# Patient Record
Sex: Female | Born: 1945 | Race: White | Hispanic: No | Smoking: Never smoker
Health system: Southern US, Community
[De-identification: ages and names within clinical notes are randomized; demographics above are authoritative.]

---

## 2013-08-10 ENCOUNTER — Encounter (HOSPITAL_COMMUNITY): Payer: Self-pay | Admitting: Emergency Medicine

## 2013-08-10 ENCOUNTER — Emergency Department (HOSPITAL_COMMUNITY)
Admission: EM | Admit: 2013-08-10 | Discharge: 2013-08-10 | Disposition: A | Discharge status: Home / Self Care | Payer: Medicare Other | Attending: Emergency Medicine | Admitting: Emergency Medicine

## 2013-08-10 ENCOUNTER — Emergency Department (HOSPITAL_COMMUNITY): Payer: Medicare Other

## 2013-08-10 DIAGNOSIS — S0003XA Contusion of scalp, initial encounter: Secondary | ICD-10-CM | POA: Insufficient documentation

## 2013-08-10 DIAGNOSIS — W010XXA Fall on same level from slipping, tripping and stumbling without subsequent striking against object, initial encounter: Secondary | ICD-10-CM | POA: Insufficient documentation

## 2013-08-10 DIAGNOSIS — S92911A Unspecified fracture of right toe(s), initial encounter for closed fracture: Secondary | ICD-10-CM

## 2013-08-10 DIAGNOSIS — S0083XA Contusion of other part of head, initial encounter: Secondary | ICD-10-CM

## 2013-08-10 DIAGNOSIS — Y92009 Unspecified place in unspecified non-institutional (private) residence as the place of occurrence of the external cause: Secondary | ICD-10-CM | POA: Insufficient documentation

## 2013-08-10 DIAGNOSIS — Y9389 Activity, other specified: Secondary | ICD-10-CM | POA: Insufficient documentation

## 2013-08-10 DIAGNOSIS — S8000XA Contusion of unspecified knee, initial encounter: Secondary | ICD-10-CM | POA: Insufficient documentation

## 2013-08-10 DIAGNOSIS — S92919A Unspecified fracture of unspecified toe(s), initial encounter for closed fracture: Secondary | ICD-10-CM | POA: Insufficient documentation

## 2013-08-10 MED ORDER — HYDROCODONE-ACETAMINOPHEN 5-325 MG PO TABS
1.0000 | ORAL_TABLET | Freq: Once | ORAL | Status: AC
  Administered 2013-08-10: 1 via ORAL
  Filled 2013-08-10: qty 1

## 2013-08-10 MED ORDER — HYDROCODONE-ACETAMINOPHEN 5-325 MG PO TABS: 2.0000 | ORAL_TABLET | ORAL | Status: DC | PRN

## 2013-08-10 NOTE — ED Provider Notes (Signed)
CSN: [id number]     Arrival date & time 08/10/13  1823 History  This chart was scribed for non-physician practitioner, Dierdre Forth, PA-C working with Roney Marion, MD by Greggory Stallion, ED scribe. This patient was seen in room TR08C/TR08C and the patient's care was started at 8:41 PM.   Chief Complaint  Patient presents with  . Fall   The history is provided by the patient. No language interpreter was used.   HPI Comments: Wanda Howell is a 67 y.o. female who presents to the Emergency Department complaining of a fall that occurred around 3:45 PM today. She states she was carrying four big wine bottles when she tripped and landed face first on both of her knees. Pt has sudden onset bilateral knee pain and abrasions to her face with associated swelling. She states she also has left great toe pain with associated swelling. Bearing weight worsens her pain. Denies facial pain.    History reviewed. No pertinent past medical history. History reviewed. No pertinent past surgical history. History reviewed. No pertinent family history. History  Substance Use Topics  . Smoking status: Never Smoker   . Smokeless tobacco: Not on file  . Alcohol Use: Yes   OB History   Grav Para Term Preterm Abortions TAB SAB Ect Mult Living                 Review of Systems  Constitutional: Negative for fever, diaphoresis, appetite change, fatigue and unexpected weight change.  HENT: Positive for facial swelling. Negative for mouth sores.   Eyes: Negative for visual disturbance.  Respiratory: Negative for cough, chest tightness, shortness of breath and wheezing.   Cardiovascular: Negative for chest pain.  Gastrointestinal: Negative for nausea, vomiting, abdominal pain, diarrhea and constipation.  Endocrine: Negative for polydipsia, polyphagia and polyuria.  Genitourinary: Negative for dysuria, urgency, frequency and hematuria.  Musculoskeletal: Positive for arthralgias and joint swelling. Negative for  back pain and neck stiffness.  Skin: Positive for wound. Negative for rash.  Allergic/Immunologic: Negative for immunocompromised state.  Neurological: Negative for syncope, light-headedness and headaches.  Hematological: Does not bruise/bleed easily.  Psychiatric/Behavioral: Negative for sleep disturbance. The patient is not nervous/anxious.     Allergies  Review of patient's allergies indicates no known allergies.  Home Medications   Current Outpatient Rx  Name  Route  Sig  Dispense  Refill  . HYDROcodone-acetaminophen (NORCO/VICODIN) 5-325 MG per tablet   Oral   Take 2 tablets by mouth every 4 (four) hours as needed.   20 tablet   0     BP 152/76  Pulse 82  Resp 18  SpO2 100%  Physical Exam  Nursing note and vitals reviewed. Constitutional: She is oriented to person, place, and time. She appears well-developed and well-nourished. No distress.  Awake, alert, nontoxic appearance  HENT:  Head: Normocephalic.  Mouth/Throat: No oropharyngeal exudate.  Abrasions noted to patient nose, oozing blood Large ecchymosis and contusion to the Left inferior portion of the orbit  Eyes: Conjunctivae are normal. No scleral icterus.  Neck: Normal range of motion.  Cardiovascular: Intact distal pulses.   nontachycardic on nursing vitals  Pulmonary/Chest: Effort normal.  Musculoskeletal: Normal range of motion. She exhibits no edema.  Swelling and ecchymosis of the left great toe with pain to palpation Patient moving all large joints of her own accord without prompting  Neurological: She is alert and oriented to person, place, and time.  Speech is clear and goal oriented Moves extremities without ataxia  Skin: Skin is warm and dry. She is not diaphoretic.    ED Course  Procedures (including critical care time)  DIAGNOSTIC STUDIES: Oxygen Saturation is 100% on RA, normal by my interpretation.    COORDINATION OF CARE: 8:52 PM-Discussed treatment plan which includes xrays with pt  at bedside and pt agreed to plan.   Labs Review Labs Reviewed - No data to display Imaging Review Dg Knee Complete 4 Views Left  08/10/2013   CLINICAL DATA:  Fall, blunt trauma  EXAM: LEFT KNEE - COMPLETE 4+ VIEW  COMPARISON:  None.  FINDINGS: Osteopenia noted. No fracture or dislocation of left knee. No joint effusion.  IMPRESSION: No fracture.  Osteopenia.   Electronically Signed   By: Genevive Bi M.D.   On: 08/10/2013 20:51   Dg Knee Complete 4 Views Right  08/10/2013   CLINICAL DATA:  Fall, blunt trauma  EXAM: RIGHT KNEE - COMPLETE 4+ VIEW  COMPARISON:  None.  FINDINGS: Osteopenia noted. No evidence of fracture dislocation of the right knee. There is a suprapatellar joint effusion.  IMPRESSION: 1. No fracture. 2. Joint effusion.   Electronically Signed   By: Genevive Bi M.D.   On: 08/10/2013 20:53   Dg Foot Complete Left  08/10/2013   CLINICAL DATA:  Fall, blunt trauma  EXAM: LEFT FOOT - COMPLETE 3+ VIEW  COMPARISON:  None.  FINDINGS: There is a vertical fracture through the proximal phalanx of the 1st digit which enters the articular surface of the 1st metatarsal phalangeal joint. No additional evidence of fraction of the midfoot or forefoot. Calcaneus is normal.  IMPRESSION: Intraarticular fracture at the base of the proximal phalanx of the 1st digit.   Electronically Signed   By: Genevive Bi M.D.   On: 08/10/2013 20:52    EKG Interpretation   None       MDM   1. Toe fracture, right, closed, initial encounter   2. Facial contusion, initial encounter   3. Knee contusion, unspecified laterality, initial encounter     MORGIN HALLS presents after fall with facial swelling and ecchymosis, swelling and ecchymosis of the great toe and c/o bilateral knee pain.  Pt reports that she is unwilling to see a PA and demands evaluation by an MD.  Pt does not want her face touched. She was advised that there could be a possible fracture but still refused it to be examined or xrayed.  She is uncooperative with the history and physical exam.  Patient discussed with Rolland Porter, MD who will assess the patient.    X-rays with Left great toe interarticular fracture at the base of the proximal phalanx.  Pt has been evaluated by Dr. Fayrene Fearing and he is comfortable with discharge.  Pt will be given post-op shoe and crutches.  Advised her to f/u with PCP.    It has been determined that no acute conditions requiring further emergency intervention are present at this time. The patient/guardian have been advised of the diagnosis and plan. We have discussed signs and symptoms that warrant return to the ED, such as changes or worsening in symptoms.   Vital signs are stable at discharge.   BP 138/83  Pulse 84  Resp 16  SpO2 100%  Patient/guardian has voiced understanding and agreed to follow-up with the PCP or specialist.       Dierdre Forth, PA-C 08/11/13 1512

## 2013-08-10 NOTE — ED Notes (Signed)
Pt reports at 1545 this pm she was carrying 4 big wine bottles to the recycle bin, reports as she opened the gate to get out of her apartment complex she fell face first landing on the curb and landed on both knees. Neighbor at bedside states as she walked by she noticed pt was laying in on the ground approx 1800 she was concerned because pt was bleeding profusely from her nose. Pt refusing to have her temperature taken in triage. Pt states she was going to go to an orthopedic tomorrow to have her knees evaluated and to get pain medication.

## 2013-08-10 NOTE — ED Notes (Signed)
Pt denies having a PCP, states her retired several years ago and "felt no need to get another one."

## 2013-08-10 NOTE — ED Notes (Signed)
Dr. James at bedside  

## 2013-08-10 NOTE — ED Provider Notes (Addendum)
Face-to-face interaction: Patient examination. She is some soft tissue swelling to the left face. She denies pain. She is not tender. She has a normal bite without malocclusion or dental trauma. She has no septal hematoma. She is an abrasion on the dorsum of nose. No septal hematoma. . No blood over the TMs. She is normal V1 through V3 distribution sensation in the face. She has normal facial musculature movement. She is nontender midline neck and spine. She has abrasions but no effusions to both knees. She has soft tissue swelling over the great toe with ecchymosis.  No indications for facial imaging. Discussed with her that she will likely develop signigicant eccymosis to the left face.. She is comfortable without imaging at this time with her normal exam. His given by mouth pain meds her discharge her pain medications. We instructed heron crutches and a cast shoe.  I have asked that she be nonweightbearing until orthopedic followup  Roney Marion, MD 08/10/13 2154  Roney Marion, MD 08/11/13 2251

## 2013-08-10 NOTE — ED Notes (Signed)
Pt comfortable with d/c and f/u instructions. Pt d/c'd with crutches and wheeled to front.

## 2013-08-10 NOTE — ED Notes (Signed)
Spoke with Dahlia Client who reports she will take a look at patient.

## 2013-08-11 ENCOUNTER — Encounter (HOSPITAL_COMMUNITY): Payer: Self-pay | Admitting: Emergency Medicine

## 2013-08-11 ENCOUNTER — Emergency Department (HOSPITAL_COMMUNITY)
Admission: EM | Admit: 2013-08-11 | Discharge: 2013-08-11 | Disposition: A | Discharge status: Home / Self Care | Payer: Medicare Other | Attending: Emergency Medicine | Admitting: Emergency Medicine

## 2013-08-11 DIAGNOSIS — M7989 Other specified soft tissue disorders: Secondary | ICD-10-CM

## 2013-08-11 DIAGNOSIS — G8911 Acute pain due to trauma: Secondary | ICD-10-CM | POA: Insufficient documentation

## 2013-08-11 NOTE — ED Notes (Signed)
MD at bedside. 

## 2013-08-11 NOTE — ED Notes (Signed)
L foot noted to be swollen and painful to touch. Large blister noted to L great toe. Able to wiggle digits. Capillary refill less than 2 seconds. Pt states pain has increased since fall. 8/10 pain at the time. Pt refusing vital signs upon arrival to ED.

## 2013-08-11 NOTE — ED Notes (Signed)
Pt presents to department via [facility] for evaluation of L great toe pain. States she fell at home yesterday. States redness, pain and blisters noted to L toe. Pedal pulses present. Pt is alert and oriented x4.

## 2013-08-11 NOTE — ED Provider Notes (Signed)
Medical screening examination/treatment/procedure(s) were conducted as a shared visit with non-physician practitioner(s) and myself.  I personally evaluated the patient during the encounter.  EKG Interpretation   None         Roney Marion, MD 08/11/13 2248

## 2013-08-11 NOTE — ED Notes (Signed)
Call received from Pt's friend to state that the power is back on at her home and she will be here in around . To pick her up. Pt. Assisted to wheel chair and escorted to lobby.

## 2013-08-11 NOTE — ED Notes (Signed)
Patient stated she did not want to be connected to the heart monitor or have her temperature taken.

## 2013-08-11 NOTE — ED Provider Notes (Signed)
CSN: [id number]     Arrival date & time 08/11/13  1844 History   First MD Initiated Contact with Patient 08/11/13 1853     Chief Complaint  Patient presents with  . Toe Pain   (Consider location/radiation/quality/duration/timing/severity/associated sxs/prior Treatment) Patient is a 67 y.o. female presenting with toe pain.  Toe Pain This is a new problem. The current episode started yesterday. The problem occurs constantly. The problem has not changed since onset.Pertinent negatives include no chest pain, no abdominal pain and no shortness of breath. Associated symptoms comments: Generalized weakness. Nothing aggravates the symptoms. Nothing relieves the symptoms. She has tried nothing for the symptoms.    History reviewed. No pertinent past medical history. History reviewed. No pertinent past surgical history. No family history on file. History  Substance Use Topics  . Smoking status: Never Smoker   . Smokeless tobacco: Not on file  . Alcohol Use: Yes   OB History   Grav Para Term Preterm Abortions TAB SAB Ect Mult Living                 Review of Systems  Respiratory: Negative for shortness of breath.   Cardiovascular: Negative for chest pain.  Gastrointestinal: Negative for abdominal pain.  All other systems reviewed and are negative.    Allergies  Review of patient's allergies indicates no known allergies.  Home Medications   Current Outpatient Rx  Name  Route  Sig  Dispense  Refill  . HYDROcodone-acetaminophen (NORCO/VICODIN) 5-325 MG per tablet   Oral   Take 2 tablets by mouth every 4 (four) hours as needed (pain).          BP 135/66  Pulse 83  Resp 12  SpO2 100% Physical Exam  Nursing note and vitals reviewed. Constitutional: She is oriented to person, place, and time. She appears well-developed and well-nourished. No distress.  HENT:  Head: Normocephalic and atraumatic.  Eyes: Conjunctivae are normal. No scleral icterus.  Neck: Neck supple.   Cardiovascular: Normal rate and intact distal pulses.   Pulmonary/Chest: Effort normal. No stridor. No respiratory distress.  Abdominal: Normal appearance. She exhibits no distension.  Musculoskeletal:  Left great toe is moderately swollen with a bullous lesion to dorsal aspect.  Skin intact.  Surrounding ecchymosis. Normal motor function.    Neurological: She is alert and oriented to person, place, and time.  Skin: Skin is warm and dry. No rash noted.  Psychiatric: She has a normal mood and affect. Her behavior is normal.    ED Course  Procedures (including critical care time) Labs Review Labs Reviewed - No data to display Imaging Review Dg Knee Complete 4 Views Left  08/10/2013   CLINICAL DATA:  Fall, blunt trauma  EXAM: LEFT KNEE - COMPLETE 4+ VIEW  COMPARISON:  None.  FINDINGS: Osteopenia noted. No fracture or dislocation of left knee. No joint effusion.  IMPRESSION: No fracture.  Osteopenia.   Electronically Signed   By: Genevive Bi M.D.   On: 08/10/2013 20:51   Dg Knee Complete 4 Views Right  08/10/2013   CLINICAL DATA:  Fall, blunt trauma  EXAM: RIGHT KNEE - COMPLETE 4+ VIEW  COMPARISON:  None.  FINDINGS: Osteopenia noted. No evidence of fracture dislocation of the right knee. There is a suprapatellar joint effusion.  IMPRESSION: 1. No fracture. 2. Joint effusion.   Electronically Signed   By: Genevive Bi M.D.   On: 08/10/2013 20:53   Dg Foot Complete Left  08/10/2013   CLINICAL DATA:  Fall, blunt trauma  EXAM: LEFT FOOT - COMPLETE 3+ VIEW  COMPARISON:  None.  FINDINGS: There is a vertical fracture through the proximal phalanx of the 1st digit which enters the articular surface of the 1st metatarsal phalangeal joint. No additional evidence of fraction of the midfoot or forefoot. Calcaneus is normal.  IMPRESSION: Intraarticular fracture at the base of the proximal phalanx of the 1st digit.   Electronically Signed   By: Genevive Bi M.D.   On: 08/10/2013 20:52    EKG  Interpretation   None       MDM   1. Toe swelling    67 yo female who fell yesterday and hurt her knees and her left great toe.  She states that power went out at her apartment.  She crawled on the floor to her telephone because she was scared to put weight on her knees.  She wanted EMS to help her to a different floor where she can stay with a friend.  She reports that they wanted her to get her toe checked out due to swelling. Her toe is swelling with a blister to the dorsal aspect. Pain control. No lacerations. She is worried that the swelling occurred because she was unable to elevate her foot during the day today. I think the swelling is post traumatic and uncomplicated.  She states she will followup with her orthopedist tomorrow. She also states that her friend will help take care of her and that she will be able to get around with friend's assistance.     Candyce Churn, MD 08/12/13 [phone]

## 2015-06-02 IMAGING — CR DG KNEE COMPLETE 4+V*L*
4 series · 4 of 4 positions shown · non-contrast
Comparison: None.

CLINICAL DATA: Fall, blunt trauma

EXAM:
LEFT KNEE - COMPLETE 4+ VIEW

[t knee ap left *]
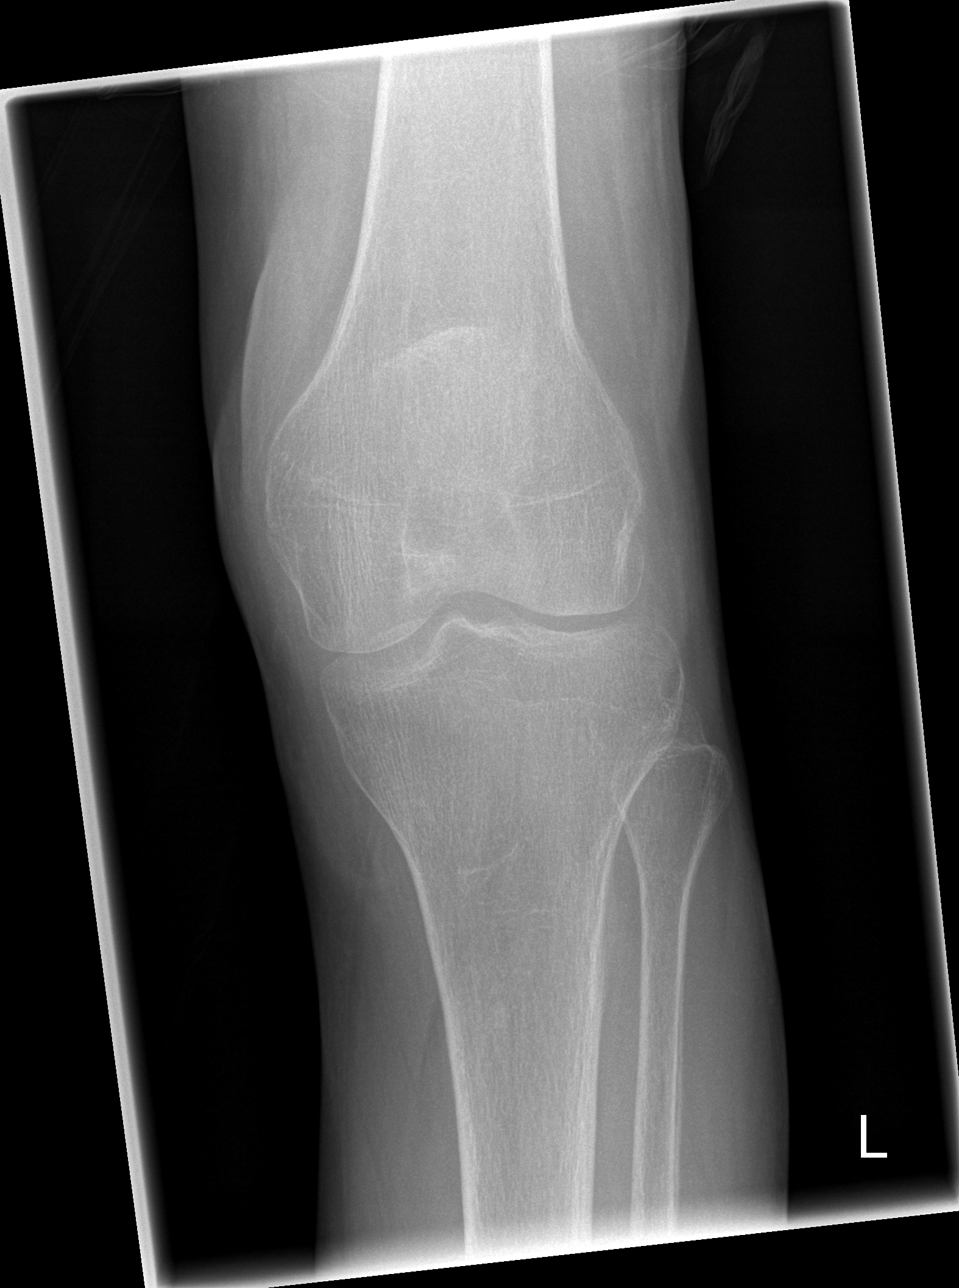

[t knee oblique left]
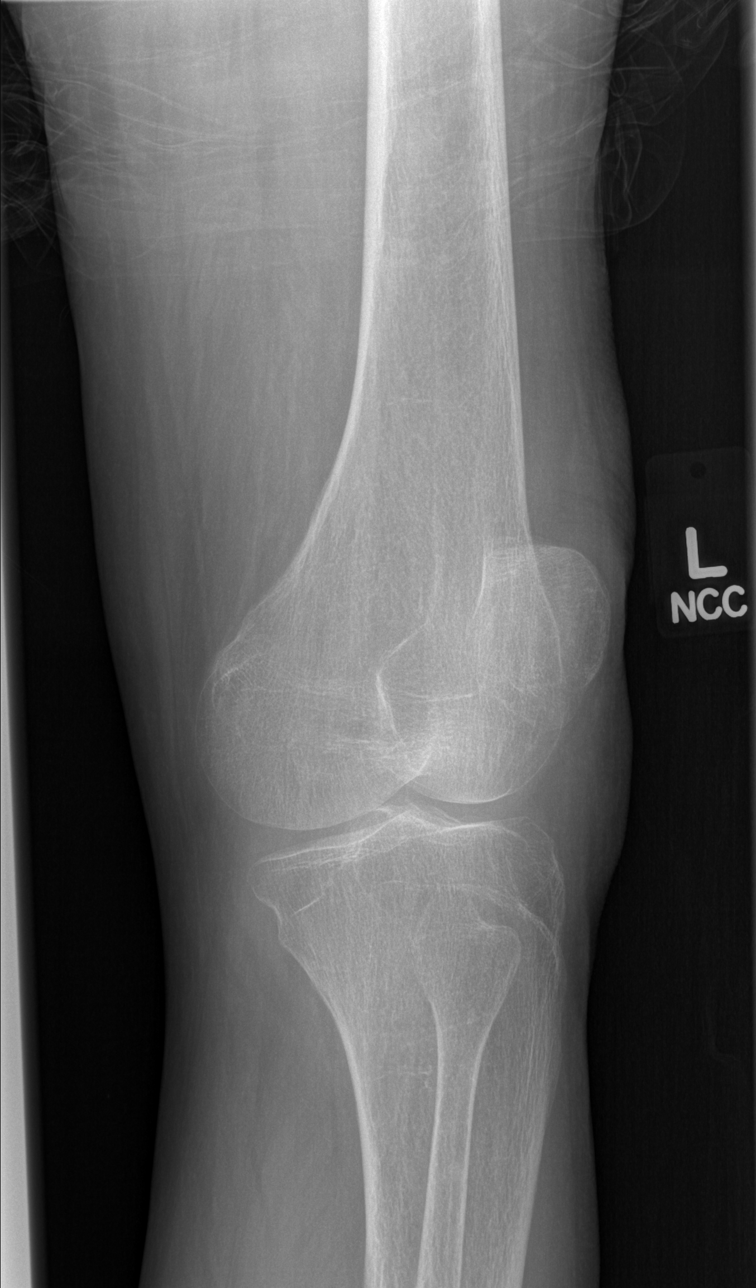

[t knee oblique left *]
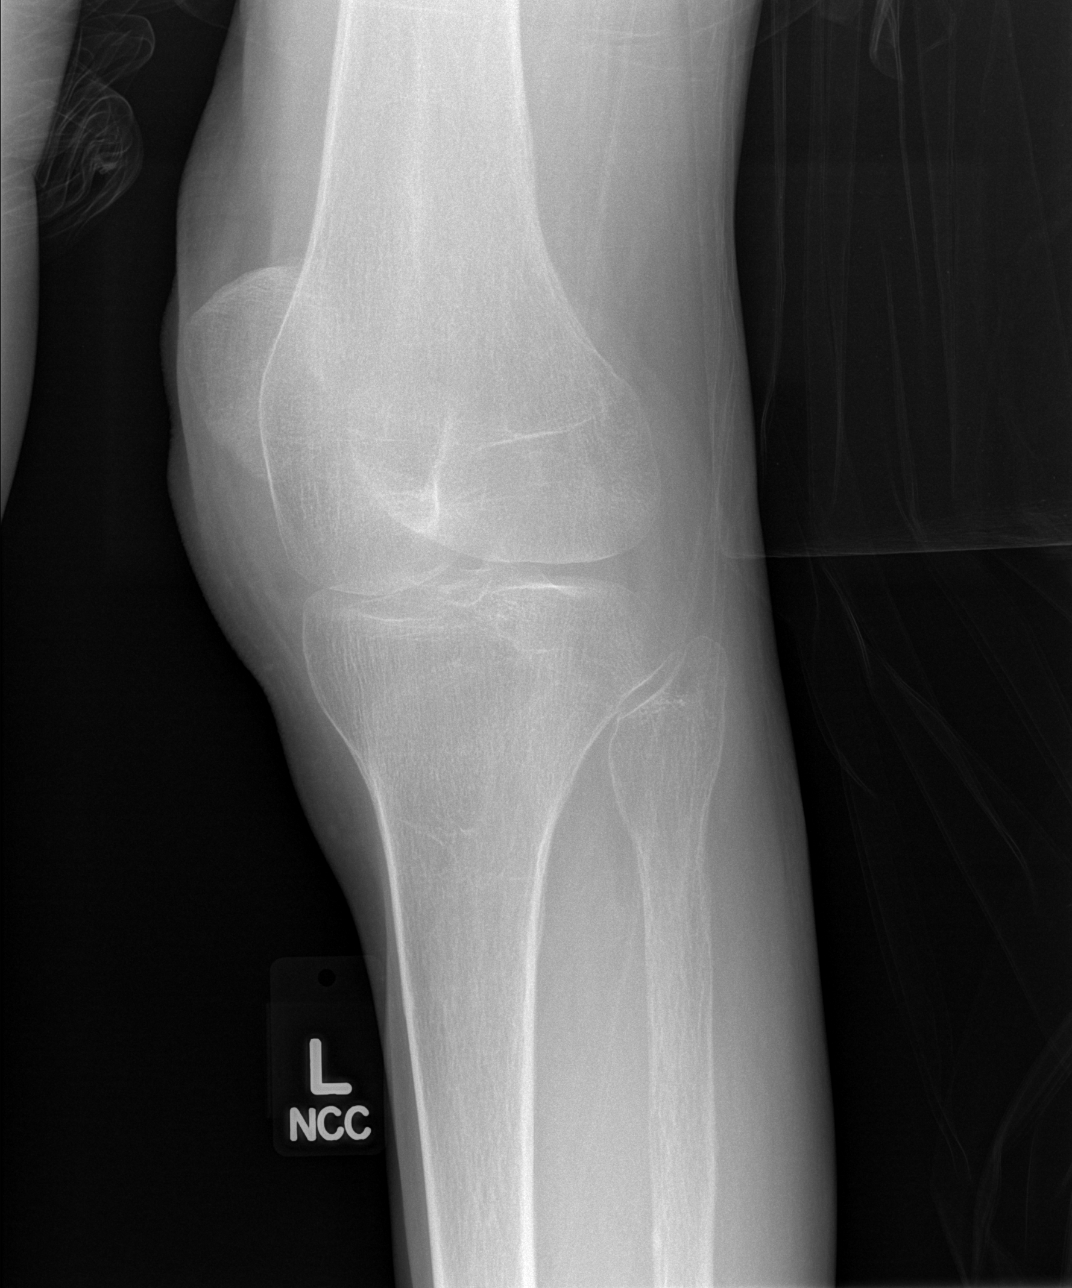

[t knee lat left]
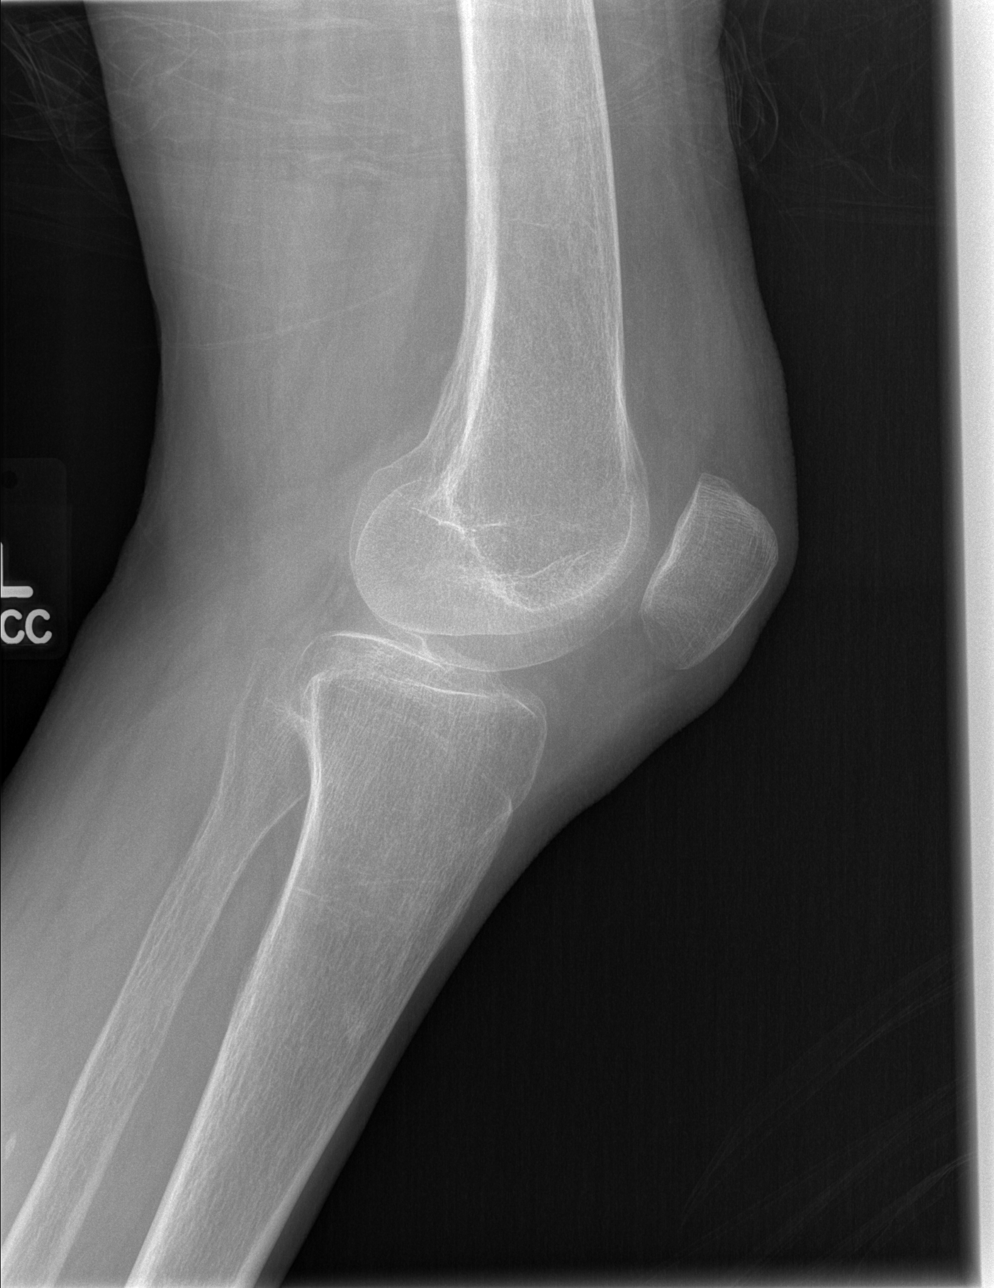

[4 of 4 positions shown; findings below may reference images not displayed]

FINDINGS: Osteopenia noted. No fracture or dislocation of left knee. No joint
effusion.
IMPRESSION: No fracture.  Osteopenia.

## 2022-11-29 ENCOUNTER — Other Ambulatory Visit: Payer: Self-pay

## 2022-11-29 ENCOUNTER — Emergency Department (HOSPITAL_COMMUNITY): Payer: Medicare Other

## 2022-11-29 ENCOUNTER — Encounter (HOSPITAL_COMMUNITY): Payer: Self-pay

## 2022-11-29 ENCOUNTER — Inpatient Hospital Stay (HOSPITAL_COMMUNITY)
Admission: EM | Admit: 2022-11-29 | Discharge: 2022-12-01 | DRG: 064 | Disposition: A | Discharge status: Home / Self Care | Payer: Medicare Other | Attending: Internal Medicine | Admitting: Internal Medicine

## 2022-11-29 DIAGNOSIS — R471 Dysarthria and anarthria: Secondary | ICD-10-CM | POA: Diagnosis present

## 2022-11-29 DIAGNOSIS — S32592A Other specified fracture of left pubis, initial encounter for closed fracture: Secondary | ICD-10-CM | POA: Diagnosis present

## 2022-11-29 DIAGNOSIS — R7989 Other specified abnormal findings of blood chemistry: Secondary | ICD-10-CM | POA: Diagnosis not present

## 2022-11-29 DIAGNOSIS — N179 Acute kidney failure, unspecified: Secondary | ICD-10-CM | POA: Diagnosis present

## 2022-11-29 DIAGNOSIS — R29810 Facial weakness: Secondary | ICD-10-CM | POA: Diagnosis present

## 2022-11-29 DIAGNOSIS — S0101XA Laceration without foreign body of scalp, initial encounter: Secondary | ICD-10-CM | POA: Diagnosis present

## 2022-11-29 DIAGNOSIS — Z7902 Long term (current) use of antithrombotics/antiplatelets: Secondary | ICD-10-CM

## 2022-11-29 DIAGNOSIS — G8324 Monoplegia of upper limb affecting left nondominant side: Secondary | ICD-10-CM | POA: Diagnosis present

## 2022-11-29 DIAGNOSIS — I639 Cerebral infarction, unspecified: Secondary | ICD-10-CM | POA: Diagnosis not present

## 2022-11-29 DIAGNOSIS — Z23 Encounter for immunization: Secondary | ICD-10-CM

## 2022-11-29 DIAGNOSIS — E559 Vitamin D deficiency, unspecified: Secondary | ICD-10-CM | POA: Diagnosis present

## 2022-11-29 DIAGNOSIS — Z66 Do not resuscitate: Secondary | ICD-10-CM | POA: Diagnosis present

## 2022-11-29 DIAGNOSIS — M818 Other osteoporosis without current pathological fracture: Secondary | ICD-10-CM | POA: Diagnosis present

## 2022-11-29 DIAGNOSIS — D649 Anemia, unspecified: Secondary | ICD-10-CM | POA: Diagnosis present

## 2022-11-29 DIAGNOSIS — E785 Hyperlipidemia, unspecified: Secondary | ICD-10-CM | POA: Diagnosis present

## 2022-11-29 DIAGNOSIS — I10 Essential (primary) hypertension: Secondary | ICD-10-CM | POA: Diagnosis present

## 2022-11-29 DIAGNOSIS — Z7982 Long term (current) use of aspirin: Secondary | ICD-10-CM | POA: Diagnosis not present

## 2022-11-29 DIAGNOSIS — W19XXXA Unspecified fall, initial encounter: Secondary | ICD-10-CM

## 2022-11-29 DIAGNOSIS — R29706 NIHSS score 6: Secondary | ICD-10-CM | POA: Diagnosis present

## 2022-11-29 DIAGNOSIS — I214 Non-ST elevation (NSTEMI) myocardial infarction: Secondary | ICD-10-CM | POA: Diagnosis present

## 2022-11-29 DIAGNOSIS — I739 Peripheral vascular disease, unspecified: Secondary | ICD-10-CM | POA: Diagnosis present

## 2022-11-29 DIAGNOSIS — R4781 Slurred speech: Secondary | ICD-10-CM

## 2022-11-29 DIAGNOSIS — W1830XA Fall on same level, unspecified, initial encounter: Secondary | ICD-10-CM | POA: Diagnosis present

## 2022-11-29 DIAGNOSIS — S92911A Unspecified fracture of right toe(s), initial encounter for closed fracture: Secondary | ICD-10-CM | POA: Diagnosis present

## 2022-11-29 DIAGNOSIS — Y92009 Unspecified place in unspecified non-institutional (private) residence as the place of occurrence of the external cause: Secondary | ICD-10-CM

## 2022-11-29 DIAGNOSIS — I6359 Cerebral infarction due to unspecified occlusion or stenosis of other cerebral artery: Secondary | ICD-10-CM | POA: Diagnosis present

## 2022-11-29 LAB — CBC WITH DIFFERENTIAL/PLATELET
Abs Immature Granulocytes: 0.05 10*3/uL (ref 0.00–0.07)
Basophils Absolute: 0 10*3/uL (ref 0.0–0.1)
Basophils Relative: 0 %
Eosinophils Absolute: 0 10*3/uL (ref 0.0–0.5)
Eosinophils Relative: 0 %
HCT: 38.1 % (ref 36.0–46.0)
Hemoglobin: 12.6 g/dL (ref 12.0–15.0)
Immature Granulocytes: 0 %
Lymphocytes Relative: 6 %
Lymphs Abs: 0.9 10*3/uL (ref 0.7–4.0)
MCH: 37.2 pg — ABNORMAL HIGH (ref 26.0–34.0)
MCHC: 33.1 g/dL (ref 30.0–36.0)
MCV: 112.4 fL — ABNORMAL HIGH (ref 80.0–100.0)
Monocytes Absolute: 0.9 10*3/uL (ref 0.1–1.0)
Monocytes Relative: 6 %
Neutro Abs: 14.1 10*3/uL — ABNORMAL HIGH (ref 1.7–7.7)
Neutrophils Relative %: 88 %
Platelets: 215 10*3/uL (ref 150–400)
RBC: 3.39 MIL/uL — ABNORMAL LOW (ref 3.87–5.11)
RDW: 14.3 % (ref 11.5–15.5)
WBC: 16 10*3/uL — ABNORMAL HIGH (ref 4.0–10.5)
nRBC: 0 % (ref 0.0–0.2)

## 2022-11-29 LAB — BASIC METABOLIC PANEL
Anion gap: 12 (ref 5–15)
BUN: 19 mg/dL (ref 8–23)
CO2: 19 mmol/L — ABNORMAL LOW (ref 22–32)
Calcium: 9.2 mg/dL (ref 8.9–10.3)
Chloride: 108 mmol/L (ref 98–111)
Creatinine, Ser: 1.16 mg/dL — ABNORMAL HIGH (ref 0.44–1.00)
GFR, Estimated: 49 mL/min — ABNORMAL LOW (ref >60)
Glucose, Bld: 96 mg/dL (ref 70–99)
Potassium: 3.9 mmol/L (ref 3.5–5.1)
Sodium: 139 mmol/L (ref 135–145)

## 2022-11-29 LAB — LIPID PANEL
Cholesterol: 190 mg/dL (ref 0–200)
HDL: 71 mg/dL (ref >40)
LDL Cholesterol: 105 mg/dL — ABNORMAL HIGH (ref 0–99)
Total CHOL/HDL Ratio: 2.7 RATIO
Triglycerides: 68 mg/dL (ref <150)
VLDL: 14 mg/dL (ref 0–40)

## 2022-11-29 LAB — TROPONIN I (HIGH SENSITIVITY)
Troponin I (High Sensitivity): 2040 ng/L (ref <18)
Troponin I (High Sensitivity): 1896 ng/L (ref <18)

## 2022-11-29 LAB — HEPARIN LEVEL (UNFRACTIONATED): Heparin Unfractionated: 0.29 IU/mL — ABNORMAL LOW (ref 0.30–0.70)

## 2022-11-29 MED ORDER — HEPARIN BOLUS VIA INFUSION
3700.0000 [IU] | Freq: Once | INTRAVENOUS | Status: AC
  Administered 2022-11-29: 3700 [IU] via INTRAVENOUS
  Filled 2022-11-29: qty 3700

## 2022-11-29 MED ORDER — ROSUVASTATIN CALCIUM 20 MG PO TABS
20.0000 mg | ORAL_TABLET | Freq: Every day | ORAL | Status: DC
  Administered 2022-11-29: 20 mg via ORAL
  Filled 2022-11-29: qty 1

## 2022-11-29 MED ORDER — SODIUM CHLORIDE 0.9 % IV BOLUS
500.0000 mL | Freq: Once | INTRAVENOUS | Status: AC
  Administered 2022-11-29: 500 mL via INTRAVENOUS

## 2022-11-29 MED ORDER — ASPIRIN 81 MG PO TBEC
81.0000 mg | DELAYED_RELEASE_TABLET | Freq: Every day | ORAL | Status: DC
  Administered 2022-11-29 – 2022-12-01 (×3): 81 mg via ORAL
  Filled 2022-11-29 (×4): qty 1

## 2022-11-29 MED ORDER — TETANUS-DIPHTH-ACELL PERTUSSIS 5-2.5-18.5 LF-MCG/0.5 IM SUSY
0.5000 mL | PREFILLED_SYRINGE | Freq: Once | INTRAMUSCULAR | Status: AC
  Administered 2022-11-29: 0.5 mL via INTRAMUSCULAR
  Filled 2022-11-29: qty 0.5

## 2022-11-29 MED ORDER — HEPARIN (PORCINE) 25000 UT/250ML-% IV SOLN
800.0000 [IU]/h | INTRAVENOUS | Status: DC
  Administered 2022-11-29: 750 [IU]/h via INTRAVENOUS
  Administered 2022-11-30: 800 [IU]/h via INTRAVENOUS
  Filled 2022-11-29 (×2): qty 250

## 2022-11-29 MED ORDER — LIDOCAINE-EPINEPHRINE (PF) 2 %-1:200000 IJ SOLN
10.0000 mL | Freq: Once | INTRAMUSCULAR | Status: AC
  Administered 2022-11-29: 10 mL
  Filled 2022-11-29: qty 20

## 2022-11-29 MED ORDER — GERHARDT'S BUTT CREAM
TOPICAL_CREAM | Freq: Two times a day (BID) | CUTANEOUS | Status: DC
  Filled 2022-11-29 (×2): qty 1

## 2022-11-29 NOTE — Consult Note (Signed)
Cardiology Consultation   Patient ID: Wanda Howell MRN: [id number]; DOB: [birth year]  Admit date: 11/29/2022 Date of Consult: 11/29/2022  PCP:  Patient, No Pcp Per   [place] Providers Cardiologist:  None        Patient Profile:   Wanda Howell is a 77 y.o. female with minimal medical history who is being seen 11/29/2022 for the evaluation of elevated troponin at the request of Dr. Laverta Baltimore.  History of Present Illness:   Wanda Howell presented to the ED today with EMS after a fall this morning. Per patient, while washing her hands in the kitchen sink, she had acute weakness of her left arm. This was followed shortly by an inability to keep her head lifted up and she then found herself falling forward, first hitting her face on the stove, then collapsing backwards. She crawled/slide back to her room and called the office at her apartment complex. An office worker/friend came to help her up but they ultimately called 911. Patient denies any prodromal symptoms including feeling lightheaded, dizzy, palpitations. Of note, patient had concern around the same time that her speech was slurred, and per patient's office manager/friend who is at bedside, speech has been abnormal today, though is slowly improving, almost back to normal now. Regarding recent hx, she has generally been feeling well though says that she had a brief period of "burning chest pain" while laying down yesterday morning after breakfast. She fell asleep and pain was gone by the time she woke up. No recurrence. She also notes recent symptoms of pain in the bottoms of her feet with ambulation. This apparently improves after several minutes. Patient also adds that her toes have become more pale/diseased appearing in recent months.   Patiently apparently does not have a regular primary care provider. She saw an OBGYN "20 years ago" and an orthopedist several years back following a fall. Otherwise, has not seen any medical  providers since. She denies hx htn, hld, DM.   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 Mild associated edema without mass effect.  History reviewed. No pertinent past medical history.  History reviewed. No pertinent surgical history.   Home Medications:  Prior to Admission medications   Medication Sig Start Date End Date Taking? Authorizing Provider  HYDROcodone-acetaminophen (NORCO/VICODIN) 5-325 MG per tablet Take 2 tablets by mouth every 4 (four) hours as needed (pain).    [provider]    Inpatient Medications: Scheduled Meds:   Continuous Infusions:  heparin 750 Units/hr (11/29/22 1535)   PRN Meds:   Allergies:   No Known Allergies  Social History:   Social History   Socioeconomic History   Marital status: Widowed    Spouse name: Not on file   Number of children: Not on file   Years of education: Not on file   Highest education level: Not on file  Occupational History   Not on file  Tobacco Use   Smoking status: Never   Smokeless tobacco: Not on file  Substance and Sexual Activity   Alcohol use: Yes   Drug use: No   Sexual activity: Not on file  Other Topics Concern   Not on file  Social History Narrative   Not on file   Social Determinants of Health   Financial Resource Strain: Not on file  Food Insecurity: Not on file  Transportation Needs: Not on file  Physical Activity: Not on  file  Stress: Not on file  Social Connections: Not on file  Intimate Partner Violence: Not on file    Family History:   History reviewed. No pertinent family history.   ROS:  Please see the history of present illness.   All other ROS reviewed and negative.     Physical Exam/Data:   Vitals:   11/29/22 1130 11/29/22 1145 11/29/22 1415 11/29/22 1544  BP: (!) 147/79 (!) 156/91  (!) 152/83  Pulse: 75 77 77 79  Resp: 19 14 12 18   Temp:    97.9 F (36.6 C)   TempSrc:    Oral  SpO2: 100% 100% 100% 100%   No intake or output data in the 24 hours ending 11/29/22 1552     No data to display           There is no height or weight on file to calculate BMI.  General:  Well nourished, well developed, in no acute distress HEENT: occipital laceration s/p sutures Neck: no JVD Vascular: No carotid bruits; Distal pulses 2+ bilaterally Cardiac:  normal S1, S2; RRR; no murmur  Lungs:  clear to auscultation bilaterally, no wheezing, rhonchi or rales  Abd: soft, nontender, no hepatomegaly  Ext: no edema. Toes appear atrophic, pale, poorly perfused/diseased. Third toe of right foot is most concerning. 2+ dorsalis pedis pulses bilaterally though feet are both quite cool to the touch. Prominent venous stasis bilaterally. Musculoskeletal:  No deformities, BUE and BLE strength normal and equal Skin: warm and dry  Neuro:  some mild left side facial droop. Very mildly slurred speech. Extremity strength grossly equal Psych:  Normal affect   EKG:  The EKG was personally reviewed and demonstrates:  sinus rhythm, non-specific T wave abnormalities in leads II, III, aVF, V4-V6.  Relevant CV Studies: No studies found  Laboratory Data:  High Sensitivity Troponin:   Recent Labs  Lab 11/29/22 1112 11/29/22 1243  TROPONINIHS 2,040* 1,896*     Chemistry Recent Labs  Lab 11/29/22 1112  NA 139  K 3.9  CL 108  CO2 19*  GLUCOSE 96  BUN 19  CREATININE 1.16*  CALCIUM 9.2  GFRNONAA 49*  ANIONGAP 12    No results for input(s): "PROT", "ALBUMIN", "AST", "ALT", "ALKPHOS", "BILITOT" in the last 168 hours. Lipids No results for input(s): "CHOL", "TRIG", "HDL", "LABVLDL", "LDLCALC", "CHOLHDL" in the last 168 hours.  Hematology Recent Labs  Lab 11/29/22 1112  WBC 16.0*  RBC 3.39*  HGB 12.6  HCT 38.1  MCV 112.4*  MCH 37.2*  MCHC 33.1  RDW 14.3  PLT 215   Thyroid No results for input(s): "TSH", "FREET4" in the last 168 hours.  BNPNo results for  input(s): "BNP", "PROBNP" in the last 168 hours.  DDimer No results for input(s): "DDIMER" in the last 168 hours.   Radiology/Studies:  MR BRAIN WO CONTRAST  Result Date: 11/29/2022 CLINICAL DATA:  Transient ischemic attack (TIA) EXAM: MRI HEAD WITHOUT CONTRAST TECHNIQUE: Multiplanar, multiecho pulse sequences of the brain and surrounding structures were obtained without intravenous contrast. COMPARISON:  CT head from the same day. 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 Mild associated edema without mass effect. No evidence of acute hemorrhage, mass lesion, midline shift or hydrocephalus. Remote left cerebellar infarct. Vascular: Major arterial flow voids are maintained at the skull base. Skull and upper cervical spine: Normal marrow signal. Sinuses/Orbits: Left maxillary sinus retention cysts. Otherwise, clear sinuses. No acute orbital findings. Other: No mastoid effusions. IMPRESSION: Acute right frontal  cortical and subcortical infarct. Electronically Signed   By: Margaretha Sheffield M.D.   On: 11/29/2022 15:22   CT Cervical Spine Wo Contrast  Result Date: 11/29/2022 CLINICAL DATA:  Trauma EXAM: CT CERVICAL SPINE WITHOUT CONTRAST TECHNIQUE: Multidetector CT imaging of the cervical spine was performed without intravenous contrast. Multiplanar CT image reconstructions were also generated. RADIATION DOSE REDUCTION: This exam was performed according to the departmental dose-optimization program which includes automated exposure control, adjustment of the mA and/or kV according to patient size and/or use of iterative reconstruction technique. COMPARISON:  None Available. FINDINGS: Alignment: Normal. Skull base and vertebrae: No acute fracture. No primary bone lesion or focal pathologic process. Soft tissues and spinal canal: No prevertebral fluid or swelling. No visible canal hematoma. Disc levels: Disc space narrowing with marginal osteophyte formation identified C5-6 and  C6-7. Osteoarthritis identified at C1-C2. Upper chest: Negative. Other: None. IMPRESSION: Degenerative changes.  No acute traumatic abnormalities. Electronically Signed   By: Sammie Bench M.D.   On: 11/29/2022 12:33   CT Head Wo Contrast  Result Date: 11/29/2022 CLINICAL DATA:  Head trauma, moderate-severe EXAM: CT HEAD WITHOUT CONTRAST TECHNIQUE: Contiguous axial images were obtained from the base of the skull through the vertex without intravenous contrast. RADIATION DOSE REDUCTION: This exam was performed according to the departmental dose-optimization program which includes automated exposure control, adjustment of the mA and/or kV according to patient size and/or use of iterative reconstruction technique. COMPARISON:  None Available. FINDINGS: Brain: There is periventricular white matter decreased attenuation consistent with small vessel ischemic changes. Ventricles, sulci and cisterns are prominent consistent with age related involutional changes. No acute intracranial hemorrhage, mass effect or shift. No hydrocephalus. Vascular: No hyperdense vessel or unexpected calcification. Skull: Normal. Negative for fracture or focal lesion. Sinuses/Orbits: No acute finding. IMPRESSION: Atrophy and chronic small vessel ischemic changes. No acute intracranial process identified. Electronically Signed   By: Sammie Bench M.D.   On: 11/29/2022 12:29   DG Pelvis Portable  Result Date: 11/29/2022 CLINICAL DATA:  Fall. EXAM: PORTABLE PELVIS 1-2 VIEWS COMPARISON:  None Available. FINDINGS: There is diffuse decreased bone mineralization. There are markedly severe degenerative changes of the superior left femoroacetabular joint with chronic moderate superior left femoral head and adjacent acetabular cortical erosion and bone remodeling with diffuse subchondral sclerosis and cystic change. 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 Mild to moderate superomedial right femoroacetabular  joint space narrowing. There is oblique linear lucency within the left inferior pubic ramus suggesting an acute fracture with up to approximately 3 mm diastasis otherwise no significant displacement. No definitive additional pelvic fracture is visualized, however usually there is more than one pelvic ring fracture, and it is possible a second left superior pubic ramus or left acetabular fracture is not visualized on this limited single frontal view. IMPRESSION: 1. Acute minimally displaced fracture of the left inferior pubic ramus. 2. No definitive additional pelvic fracture is visualized, however usually there is more than one pelvic ring fracture, and it is possible a second left superior pubic ramus or left acetabular fracture is not visualized on this limited frontal view. 3. 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 Electronically Signed   By: Yvonne Kendall M.D.   On: 11/29/2022 11:55   DG Chest Portable 1 View  Result Date: 11/29/2022 CLINICAL DATA:  Fall. EXAM: PORTABLE CHEST 1 VIEW COMPARISON:  None Available. FINDINGS: Cardiac silhouette and mediastinal contours are within normal limits. Moderate  calcification within the aortic arch. The patient is mildly rotated to the left. Within this limitation, no focal airspace opacity is seen. No pleural effusion pneumothorax. No definite acute displaced rib fracture is visualized. IMPRESSION: 1. No acute cardiopulmonary process. 2. No definite acute displaced rib fracture. Electronically Signed   By: Yvonne Kendall M.D.   On: 11/29/2022 11:36     Assessment and Plan:   NSTEMI  Patient admitted following a fall at home. Found with elevated troponin 2040->1896. ECG with non-specific T wave changes. Isolated burning chest pain yesterday morning but no recurrence since. No known cardiac hx.  Patient should have evaluation of coronary anatomy given elevated troponins, though currently she  appears reluctant to undergo any invasive procedure. Given acute neurological deficits and acute right frontal cortical and subcortical infarct, recommend neurology evaluation before possible cardiac cath. IF cleared by neurology, 48 hours of heparin would be reasonable TTE ordered Lipid panel ordered Defer GDMT including beta blocker at this time given possible need for permission hypertension.  Hypertension  Given acute neurological deficits today and abnormal brain MRI, defer BP management until neuro eval to determine need for permissive hypertension.   Acute neurological deficits  MRI brain with acute right frontal cortical and subcortical infarct. Defer further workup to primary team/neuro.  Peripheral vascular disease  Defer to primary team but would consider doppler ultrasonographic studies.  Risk Assessment/Risk Scores:     TIMI Risk Score for Unstable Angina or Non-ST Elevation MI:   The patient's TIMI risk score is 2, which indicates a 8% risk of all cause mortality, new or recurrent myocardial infarction or need for urgent revascularization in the next 14 days.          For questions or updates, please contact [place] Please consult [email or web address] for contact info under    Signed, Lily Kocher, PA-C  11/29/2022 3:52 PM

## 2022-11-29 NOTE — Progress Notes (Signed)
ANTICOAGULATION CONSULT NOTE - Initial Consult  Pharmacy Consult for heparin Indication: chest pain/ACS and stroke  No Known Allergies  Patient Measurements:   Heparin Dosing Weight: 68 kg   Vital Signs: Temp: 97.9 F (36.6 C) (03/21 1544) Temp Source: Oral (03/21 1544) BP: 152/90 (03/21 1745) Pulse Rate: 78 (03/21 1745)  Labs: Recent Labs    11/29/22 1112 11/29/22 1243  HGB 12.6  --   HCT 38.1  --   PLT 215  --   CREATININE 1.16*  --   TROPONINIHS 2,040* 1,896*    CrCl cannot be calculated (Unknown ideal weight.).  Medical History: History reviewed. No pertinent past medical history.  Medications:  Infusions:   Assessment: Wanda Howell is a 77 year old F admitted after a fall found to have elevated troponin at 2040. Patient reports no medications prior to admission. Patient found to have approximately 3 cm occipital scalp laceration with minimal bleeding. Will round heparin doses down due to bleed.  Heparin previously initiated utilizing ACS protocol. MRI revealing Acute right frontal cortical and subcortical infarct.   Hgb 12.6, plt 215.  Goal of Therapy:  Heparin level 0.3-0.5 units/ml Monitor platelets by anticoagulation protocol: Yes   Plan:  Target heparin level goal modified Refrain from further heparin boluses for now Resume heparin at 750 units/hr. 2200 Heparin level Daily CBC, heparin level. Monitor for signs/symptoms of bleeding.   Lorelei Pont, PharmD, BCPS 11/29/2022 6:04 PM ED Clinical Pharmacist -  [phone]
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
## 2022-11-29 NOTE — ED Provider Notes (Signed)
Emergency Department Provider Note   I have reviewed the triage vital signs and the nursing notes.   HISTORY  Chief Complaint Fall   HPI Wanda Howell is a 77 y.o. female presents emergency department for evaluation after fall with head injury and scalp laceration.  She lives in an apartment building, alone.  She felt like her hand locked up this morning and went to grab it but states at that point she seemed to lose balance and fall backwards striking her head she thinks on the cabinet.  There was some bleeding.  She was able to scoot along the floor to her bedroom to make a phone call to the leasing office at her apartment complex.  They came to help her up and ultimately called 911 for transport to the emergency department.  She has no family in the area and occasionally speaks with friends.  The last time she spoke with anybody was several days ago which is her last normal.  She is thinking that her speech seems a little bit off to her as well.  She is no longer having left arm symptoms.   She tells me that she does not have a primary care physician and has not seen a physician of any kind for the past 20 years after her PCP died.  Her last fall was several years ago and at baseline she walks around without a walker or other assist device. She is strongly against moving to independent or assisted living per our conversation today.    History reviewed. No pertinent past medical history.  Review of Systems  Constitutional: No fever/chills Cardiovascular: Denies chest pain. Respiratory: Denies shortness of breath. Gastrointestinal: No abdominal pain.  No nausea, no vomiting.   Musculoskeletal: Negative for back pain. Skin: Negative for rash. Neurological: Positive posterior HA. ? Speech disturbance. Transient left arm spasm (resolved).   ____________________________________________   PHYSICAL EXAM:  VITAL SIGNS: ED Triage Vitals  Enc Vitals Group     BP 11/29/22 1029 (!)  172/96     Pulse Rate 11/29/22 1039 85     Resp 11/29/22 1029 20     Temp 11/29/22 1113 98.6 F (37 C)     Temp Source 11/29/22 1113 Oral     SpO2 11/29/22 1039 99 %   Constitutional: Alert and oriented. Well appearing and in no acute distress. Eyes: Conjunctivae are normal. PERRL. EOMI. Head: 3 cm posterior scalp laceration. Wound with minimal oozing at this time. No galea involvement.  Nose: No congestion/rhinnorhea. Mouth/Throat: Mucous membranes are moist.   Neck: No stridor.   Cardiovascular: Normal rate, regular rhythm. Good peripheral circulation. Grossly normal heart sounds.   Respiratory: Normal respiratory effort.  No retractions. Lungs CTAB. Gastrointestinal: Soft and nontender. No distention.  Musculoskeletal: No lower extremity tenderness nor edema.  Neurologic: Question some slight dysarthria. 5/5 strength in the bilateral upper and lower extremities with normal sensation throughout.  Skin:  Skin is warm and dry. Chronic skin changes to the feet/toes.   ____________________________________________   LABS (all labs ordered are listed, but only abnormal results are displayed)  Labs Reviewed  BASIC METABOLIC PANEL - Abnormal; Notable for the following components:      Result Value   CO2 19 (*)    Creatinine, Ser 1.16 (*)    GFR, Estimated 49 (*)    All other components within normal limits  CBC WITH DIFFERENTIAL/PLATELET - Abnormal; Notable for the following components:   WBC 16.0 (*)  RBC 3.39 (*)    MCV 112.4 (*)    MCH 37.2 (*)    Neutro Abs 14.1 (*)    All other components within normal limits  TROPONIN I (HIGH SENSITIVITY) - Abnormal; Notable for the following components:   Troponin I (High Sensitivity) 2,040 (*)    All other components within normal limits  TROPONIN I (HIGH SENSITIVITY) - Abnormal; Notable for the following components:   Troponin I (High Sensitivity) 1,896 (*)    All other components within normal limits  URINALYSIS, ROUTINE W REFLEX  MICROSCOPIC  HEPARIN LEVEL (UNFRACTIONATED)   ____________________________________________  EKG   EKG Interpretation  Date/Time:  Thursday November 29 2022 10:29:06 EDT Ventricular Rate:  82 PR Interval:  151 QRS Duration: 100 QT Interval:  400 QTC Calculation: 468 R Axis:   69 Text Interpretation: Sinus rhythm Nonspecific repol abnormality, diffuse leads No prior for comparison Confirmed by Nanda Quinton [phone]) on 11/29/2022 11:14:43 AM        ____________________________________________  RADIOLOGY  MR BRAIN WO CONTRAST  Result Date: 11/29/2022 CLINICAL DATA:  Transient ischemic attack (TIA) EXAM: MRI HEAD WITHOUT CONTRAST TECHNIQUE: Multiplanar, multiecho pulse sequences of the brain and surrounding structures were obtained without intravenous contrast. COMPARISON:  CT head from the same day. 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 Mild associated edema without mass effect. No evidence of acute hemorrhage, mass lesion, midline shift or hydrocephalus. Remote left cerebellar infarct. Vascular: Major arterial flow voids are maintained at the skull base. Skull and upper cervical spine: Normal marrow signal. Sinuses/Orbits: Left maxillary sinus retention cysts. Otherwise, clear sinuses. No acute orbital findings. Other: No mastoid effusions. IMPRESSION: Acute right frontal cortical and subcortical infarct. Electronically Signed   By: Margaretha Sheffield M.D.   On: 11/29/2022 15:22   CT Cervical Spine Wo Contrast  Result Date: 11/29/2022 CLINICAL DATA:  Trauma EXAM: CT CERVICAL SPINE WITHOUT CONTRAST TECHNIQUE: Multidetector CT imaging of the cervical spine was performed without intravenous contrast. Multiplanar CT image reconstructions were also generated. RADIATION DOSE REDUCTION: This exam was performed according to the departmental dose-optimization program which includes automated exposure control, adjustment of the mA and/or kV according to patient size  and/or use of iterative reconstruction technique. COMPARISON:  None Available. FINDINGS: Alignment: Normal. Skull base and vertebrae: No acute fracture. No primary bone lesion or focal pathologic process. Soft tissues and spinal canal: No prevertebral fluid or swelling. No visible canal hematoma. Disc levels: Disc space narrowing with marginal osteophyte formation identified C5-6 and C6-7. Osteoarthritis identified at C1-C2. Upper chest: Negative. Other: None. IMPRESSION: Degenerative changes.  No acute traumatic abnormalities. Electronically Signed   By: Sammie Bench M.D.   On: 11/29/2022 12:33   CT Head Wo Contrast  Result Date: 11/29/2022 CLINICAL DATA:  Head trauma, moderate-severe EXAM: CT HEAD WITHOUT CONTRAST TECHNIQUE: Contiguous axial images were obtained from the base of the skull through the vertex without intravenous contrast. RADIATION DOSE REDUCTION: This exam was performed according to the departmental dose-optimization program which includes automated exposure control, adjustment of the mA and/or kV according to patient size and/or use of iterative reconstruction technique. COMPARISON:  None Available. FINDINGS: Brain: There is periventricular white matter decreased attenuation consistent with small vessel ischemic changes. Ventricles, sulci and cisterns are prominent consistent with age related involutional changes. No acute intracranial hemorrhage, mass effect or shift. No hydrocephalus. Vascular: No hyperdense vessel or unexpected calcification. Skull: Normal. Negative for fracture or focal lesion. Sinuses/Orbits: No acute finding. IMPRESSION: Atrophy and chronic small  vessel ischemic changes. No acute intracranial process identified. Electronically Signed   By: Sammie Bench M.D.   On: 11/29/2022 12:29   DG Pelvis Portable  Result Date: 11/29/2022 CLINICAL DATA:  Fall. EXAM: PORTABLE PELVIS 1-2 VIEWS COMPARISON:  None Available. FINDINGS: There is diffuse decreased bone  mineralization. There are markedly severe degenerative changes of the superior left femoroacetabular joint with chronic moderate superior left femoral head and adjacent acetabular cortical erosion and bone remodeling with diffuse subchondral sclerosis and cystic change. 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 Mild to moderate superomedial right femoroacetabular joint space narrowing. There is oblique linear lucency within the left inferior pubic ramus suggesting an acute fracture with up to approximately 3 mm diastasis otherwise no significant displacement. No definitive additional pelvic fracture is visualized, however usually there is more than one pelvic ring fracture, and it is possible a second left superior pubic ramus or left acetabular fracture is not visualized on this limited single frontal view. IMPRESSION: 1. Acute minimally displaced fracture of the left inferior pubic ramus. 2. No definitive additional pelvic fracture is visualized, however usually there is more than one pelvic ring fracture, and it is possible a second left superior pubic ramus or left acetabular fracture is not visualized on this limited frontal view. 3. 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 Electronically Signed   By: Yvonne Kendall M.D.   On: 11/29/2022 11:55   DG Chest Portable 1 View  Result Date: 11/29/2022 CLINICAL DATA:  Fall. EXAM: PORTABLE CHEST 1 VIEW COMPARISON:  None Available. FINDINGS: Cardiac silhouette and mediastinal contours are within normal limits. Moderate calcification within the aortic arch. The patient is mildly rotated to the left. Within this limitation, no focal airspace opacity is seen. No pleural effusion pneumothorax. No definite acute displaced rib fracture is visualized. IMPRESSION: 1. No acute cardiopulmonary process. 2. No definite acute displaced rib fracture. Electronically Signed   By:  Yvonne Kendall M.D.   On: 11/29/2022 11:36    ____________________________________________   PROCEDURES  Procedure(s) performed:   Marland KitchenMarland KitchenLaceration Repair  Date/Time: 11/29/2022 2:11 PM  Performed by: Margette Fast, MD Authorized by: Margette Fast, MD   Consent:    Consent obtained:  Verbal   Consent given by:  Patient   Risks, benefits, and alternatives were discussed: yes     Risks discussed:  Infection, need for additional repair, nerve damage, pain, poor cosmetic result, poor wound healing, retained foreign body and vascular damage   Alternatives discussed:  No treatment Universal protocol:    Patient identity confirmed:  Verbally with patient Anesthesia:    Anesthesia method:  Local infiltration   Local anesthetic:  Lidocaine 2% WITH epi Laceration details:    Location:  Scalp   Scalp location:  Occipital   Length (cm):  4 Pre-procedure details:    Preparation:  Patient was prepped and draped in usual sterile fashion and imaging obtained to evaluate for foreign bodies Exploration:    Limited defect created (wound extended): no     Hemostasis achieved with:  Direct pressure   Imaging obtained comment:  CT head   Imaging outcome: foreign body not noted     Wound exploration: entire depth of wound visualized     Wound extent: areolar tissue not violated, fascia not violated, no foreign body, no signs of injury, no nerve damage, no tendon damage, no underlying fracture and no vascular damage  Contaminated: no   Treatment:    Area cleansed with:  Povidone-iodine and saline   Amount of cleaning:  Standard   Irrigation solution:  Sterile saline   Irrigation method:  Pressure wash   Visualized foreign bodies/material removed: no   Skin repair:    Repair method:  Sutures   Suture size:  4-0   Wound skin closure material used: Vicryl rapede.   Number of sutures:  4 Approximation:    Approximation:  Close Repair type:    Repair type:  Simple Post-procedure details:     Dressing:  Open (no dressing)   Procedure completion:  Tolerated well, no immediate complications .Critical Care  Performed by: Margette Fast, MD Authorized by: Margette Fast, MD   Critical care provider statement:    Critical care time (minutes):  35   Critical care time was exclusive of:  Separately billable procedures and treating other patients and teaching time   Critical care was necessary to treat or prevent imminent or life-threatening deterioration of the following conditions:  Cardiac failure   Critical care was time spent personally by me on the following activities:  Development of treatment plan with patient or surrogate, discussions with consultants, evaluation of patient's response to treatment, examination of patient, ordering and review of laboratory studies, ordering and review of radiographic studies, ordering and performing treatments and interventions, pulse oximetry, re-evaluation of patient's condition, review of old charts and obtaining history from patient or surrogate   I assumed direction of critical care for this patient from another provider in my specialty: no     Care discussed with: admitting provider      ____________________________________________   INITIAL IMPRESSION / [place] / ED COURSE  Pertinent labs & imaging results that were available during my care of the patient were reviewed by me and considered in my medical decision making (see chart for details).   This patient is Presenting for Evaluation of head injury, which does require a range of treatment options, and is a complaint that involves a high risk of morbidity and mortality.  The Differential Diagnoses includes but is not exclusive to alcohol, illicit or prescription medications, intracranial pathology such as stroke, intracerebral hemorrhage, fever or infectious causes including sepsis, hypoxemia, uremia, trauma, endocrine related disorders such as diabetes, hypoglycemia,  thyroid-related diseases, etc.   Critical Interventions-    Medications  heparin bolus via infusion 3,700 Units (has no administration in time range)  heparin ADULT infusion 100 units/mL (25000 units/23mL) (has no administration in time range)  Tdap (BOOSTRIX) injection 0.5 mL (0.5 mLs Intramuscular Given 11/29/22 1138)  sodium chloride 0.9 % bolus 500 mL (500 mLs Intravenous New Bag/Given 11/29/22 1139)  lidocaine-EPINEPHrine (XYLOCAINE W/EPI) 2 %-1:200000 (PF) injection 10 mL (10 mLs Infiltration Given 11/29/22 1139)    Reassessment after intervention:  Symptoms unchanged    I did obtain Additional Historical Information from friend/apartment office employee at bedside. Advises that her speech also seems different but has not spoke with her in several days.   I decided to review pertinent External Data, and in summary no prior records in our system.    Clinical Laboratory Tests Ordered, included troponin of 2024 --> 1896. Creatinine mildly elevated. Leukocytosis to 16. No anemia.   Radiologic Tests Ordered, included CT head, CXR, and pelvis x-ray. I independently interpreted the images and agree with radiology interpretation.   Cardiac Monitor Tracing which shows NSR.    Social Determinants of Health Risk patient lives alone at  an apartment.   Consult complete with Cardiology. Patient with CP yesterday and troponin elevated. Will start heparin. No ICH on CT.   Plan for medicine admit. Spoke with team who will be down to see the patient.   Medical Decision Making: Summary:  Patient presents emergency department evaluation after fall.  She was having some left arm symptoms which are difficult to ascertain prior to falling.  This prompted additional lab workup and EKG.  I do not have any prior labs or tracings to compare this with.  No active pain other than her posterior scalp.  She has an approximately 3 cm occipital scalp laceration with minimal bleeding.  Considered washout and  immediate repair but will hold for neuroimaging and possible MRI.   Reevaluation with update and discussion with patient. Laceration repaired with absorbable suture rather than staples anticipating MRI.    Patient's presentation is most consistent with acute presentation with potential threat to life or bodily function.   Disposition: admit  ____________________________________________  FINAL CLINICAL IMPRESSION(S) / ED DIAGNOSES  Final diagnoses:  NSTEMI (non-ST elevated myocardial infarction) ([place])  Fall, initial encounter  Laceration of scalp, initial encounter  Slurred speech    Note:  This document was prepared using Dragon voice recognition software and may include unintentional dictation errors.  Nanda Quinton, MD, [facility] Emergency Medicine    Astaria Nanez, Wonda [facility], MD 11/29/22 [phone]

## 2022-11-29 NOTE — ED Notes (Signed)
Patient transported to MRI 

## 2022-11-29 NOTE — Consult Note (Signed)
Neurology Consultation Reason for Consult: Stroke Referring Physician: Philipp Howell, C  CC: Slurred speech  History is obtained from: Patient  HPI: Wanda Howell is a 77 y.o. female who states that her initial symptoms started yesterday, she had burning in her chest which she thought was heartburn, this improved gradually over the course of the day.  She first noticed things the first time she was washing her hands immediately after waking up, she states that her hand was just flailing and weak.,  Subsequently, she was standing at her stove (which was thankfully off) when she suddenly collapsed down onto it and then slowly fell off of it onto her back.  She crawled to her bedroom where she called for assistance who helped her to stand up and she was able to walk to her bed.  Due to the symptoms, she sought care in the emergency department where it was noted that she had an elevated troponin, and she also had an MRI performed which was positive for stroke.  She never noticed her slurred speech, or any of her left-sided weakness.  LKW: 3/20 prior to bed tnk given?: no, outside of window  History reviewed. No pertinent past medical history.   History reviewed. No pertinent family history.   Social History:  reports that she has never smoked. She does not have any smokeless tobacco history on file. She reports current alcohol use. She reports that she does not use drugs.   Exam: Current vital signs: BP (!) 169/87 (BP Location: Left Arm)   Pulse 76   Temp 99 F (37.2 C) (Oral)   Resp 17   Ht 5' 10.5" (1.791 m)   Wt 58.3 kg   SpO2 98%   BMI 18.18 kg/m  Vital signs in last 24 hours: Temp:  [97.5 F (36.4 C)-99 F (37.2 C)] 99 F (37.2 C) (03/21 2011) Pulse Rate:  [75-93] 76 (03/21 2011) Resp:  [12-20] 17 (03/21 2011) BP: (147-174)/(79-112) 169/87 (03/21 2011) SpO2:  [93 %-100 %] 98 % (03/21 2011) Weight:  [58.3 kg] 58.3 kg (03/21 1815)   Physical Exam  Appears well-developed  and well-nourished.   Neuro: Mental Status: Patient is awake, alert, oriented to person, place, month, year, and situation. Patient is able to give a clear and coherent history. No signs of aphasia or neglect Cranial Nerves: II: Visual Fields are full. Pupils are equal, round, and reactive to light.   III,IV, VI: EOMI without ptosis or diploplia.  V: Facial sensation is symmetric to temperature VII: Facial movement with left facial weakness VIII: hearing is intact to voice X: Uvula elevates symmetrically XI: Shoulder shrug is symmetric. XII: tongue is midline without atrophy or fasciculations.  Motor: She has 5/5 strength on the right side in the arm and leg, on the left she has 4/5 strength of the left arm, limited in the left leg due to previous pelvic fracture and pain, but good strength distally Sensory: Sensation is symmetric to light touch and temperature in the arms and legs. Cerebellar: No clear ataxia on the right, consistent with mild weakness in the left      I have reviewed labs in epic and the results pertinent to this consultation are: LDL 105 Troponin 1896  I have reviewed the images obtained: MRI brain - right cortically based stroke.   Impression: 77 year old female who presents with fall followed by mild left-sided weakness.  My suspicion is that initially she had a larger area at risk, resulting in neglect and a  fall, but then this moved distally resulting in her mild paresis.  This is pure speculation and does not change management.  As to her troponin, this is higher than I typically think of being solely explained by stroke, and especially with her chest pain yesterday, I would not favor attributing this solely to stroke.  She is on heparin for ACS, and though I do favor using stroke protocol dosing, given there is more than just atrial fibrillation as an indication, I think it is reasonable to continue this.  Recommendations: - Intensive statin therapy  with atorvastatin 40mg  qhs - A1c - Frequent neuro checks - Echocardiogram - CTA head and neck - Prophylactic therapy-Antiplatelet med: Aspirin -81 mg (Plavix per cardiology) and heparin - Risk factor modification - Telemetry monitoring - PT consult, OT consult, Speech consult - Stroke team to follow    Roland Rack, MD [facility] [phone]  If 7pm- 7am, please page neurology on call as listed in [place].

## 2022-11-29 NOTE — ED Notes (Signed)
Troponin 2040. EDP Long made aware.

## 2022-11-29 NOTE — H&P (Signed)
Date: 11/29/2022               Patient Name:  Wanda Howell MRN: [id number]  DOB: Oct 09, 1945 Age / Sex: 77 y.o., female   PCP: Patient, No Pcp Per         Medical Service: Internal Medicine Teaching Service         Attending Physician: Dr. Velna Ochs, MD    First Contact: Dr. Iona Coach Pager: [phone]  Second Contact: Dr. Gaylan Gerold  Pager: [phone]       After Hours (After 5p/  First Contact Pager: [phone]  weekends / holidays): Second Contact Pager: [phone]   Chief Complaint: head injury and laceration s/p fall from standing   History of Present Illness: Wanda Stauch. Howell is a 77 y/o female with unknown PMHx who presented to ED after a fall this morning at 8:30 AM. She lives alone in an apartment. Per patient, she went to the kitchen to wash her hands, reached for soap with left hand, and left hand clenched into a fist involuntarily. She pulled it back with her right hand and went to get paper towels. Her left hand could not pull the paper towels, and it her hand dropped at the wrist. Her head flopped forward involuntarily and landed on the stove. She slid down front of the stove onto the floor, slid down to pantry to get a garbage bag to slide to her phone to get help. She contacted Glade Stanford, who works in her apartment [employer] office. Glade Stanford accompanies her at bedside and notes that Ms. Tzipporah had slurred speech and a significant bleeding head laceration - this prompted Glade Stanford to call 911 for transport to the ED.   Of note, she had an episode of chest pain yesterday morning. She originally thought this was heartburn because she was eating at the time, but also notes that she does not have a history of heartburn. This chest pain lasted a several minutes, but did not radiate to her arm or neck. She denied any sweating, lightheadedness, or N/V. She took a nap and when she woke up the chest pain had resolved.  At bedside she is awake and oriented. Patient is very  wary of PAs, medical students, and interns. 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 Likely requires significant rapport going forward.   Meds: None  Allergies: Allergies as of 11/29/2022   (No Known Allergies)   History reviewed. No pertinent past medical history.  Family History: No family history of heart issues or clotting disorders.  Social History:   She lives alone in an apartment and states that she is able to do her own daily activities including bathing, dressing, and cooking for herself. When asked about social support she said "I don't need support." She has friends who live nearby who she last contacted a few days ago. Ms. Vonciel mentioned that her friends will get her groceries when she needs them. She denies smoking and recreational drug use. She endorses drinking a bottle of wine per week.   Review of Systems: A complete ROS was negative except as per HPI.   Physical Exam: Blood pressure (!) 152/90, pulse 78, temperature 97.9 F (36.6 C), temperature source Oral, resp. rate 20, SpO2  97 %.  Patient declined to do a physical exam. She allowed a partial Neuro and Cardio exam. Cardio: Regular rate and rhythm Neuro: Left side face droop appreciated. No strength deficits noted bilaterally for upper and lower extremities. Dorsiflexion and plantarflexion intact bilaterally.  Extremities: significant purple discoloring on lower legs. Right toe is disfigured - appears chronic and non-necrotic.   EKG: I personally reviewed my interpretation is sinus rhythm with regular rate  CXR I personally reviewed my interpretation is no acute cardiopulmonary process. No rib fracture or pneumothorax.  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   CT Head:  I personally reviewed my interpretation is chronic small vessel ischemic changes   MRI: I personally reviewed my interpretation is acute right frontal cortical and subcortical infarct  Assessment & Plan by Problem: Principal Problem:   Acute CVA (cerebrovascular accident) ([place]) Active Problems:   NSTEMI (non-ST elevated myocardial infarction) ([place])   Fracture of ramus of left pubis (HCC)  #Acute CVA Her MRI results demonstrated an acute right frontal cortical and subcortical infarct. On exam, she has a left facial droop. Her strength is symmetrical bilaterally. Unsure of the etiology of her CVA due to unremarkable past medical history. Ordered a lipid panel and A1C. She is also being anticoagulated with a heparin drip.  - acute PT to assist her in returning to baseline - monitor telemetry - continue heparin drip - Pending Echocardiogram - ASA 81 mg, Crestor 20mg   - appreciate neurology recs  #NSTEMI Her ED troponin levels were 1896. Her acute chest pain yesterday 3/20 while at rest is consistent with an NSTEMI diagnosis. She denies any chest pain after this episode yesterday. Patient was seen at bedside by cardiology and seems hesitant to do a cath procedure. She was started on aspirin 81mg  and IV heparin.  - Pending Echocardiogram. Cardiology may perform CT coronary instead of heart cath if her Echo is unremarkable.   #Lacerated head wound Her head wound was sutured in the ED.  #Left Pubic Ramus Fracture #Osteoporosis Her Pelvis X-ray demonstrated a left pubic ramus fracture after her fall. She denies any current pain in her left leg, but is unable to lift it on exam. I think she could benefit from acute PT, especially post-CVA to support her return to baseline independence in ADLs.  In a patient who is  postmenopausal, a fall from standing height is concerning for a diagnosis of osteoporosis. Would recommend bisphonates in outpatient follow up.   #Suspected Venous Stasis Patient reports that she has pain in her legs when she stands still on her feet. On exam, she has dark purple discoloration in her lower extremities that is c/f venous stasis especially for a patient who has not seen a doctor in decades. Was unable to assess blood flow due to patient declining physical exam.   #Chronic Right Toe fracture Patient says her toe had been broken for years, but started to fall off more in the recent months. No acute concerns at this time, but she could benefit from outpatient follow up as a primary prevention to infection  Signed: Hoyt Koch, Medical Student 11/29/2022, 5:51 PM   Attestation for Student Documentation:  I personally was present and performed or re-performed the history, physical exam and medical decision-making activities of this service and have verified that the service and findings are accurately  documented in the student's note.  Gaylan Gerold, DO 11/29/2022, 6:33 PM

## 2022-11-29 NOTE — ED Notes (Signed)
ED TO INPATIENT HANDOFF REPORT  ED Nurse Name and Phone #: [phone], Cathlean Sauer Name/Age/Gender Wanda Howell 77 y.o. female Room/Bed: 015C/015C  Code Status   Code Status: DNR  Home/SNF/Other Home Patient oriented to: self, place, time, and situation Is this baseline? Yes   Triage Complete: Triage complete  Chief Complaint Acute CVA (cerebrovascular accident) [facility]) [I63.9]  Triage Note Pt BIB [facility] from home d/t a ground level mechanical fall. Slipped and fell, no LOC, no blood thinner. Lac on th posterior part of the head. Bleeding controlled. A&O X4. Pt lives by herself And is compliant w her meds.    Allergies No Known Allergies  Level of Care/Admitting Diagnosis ED Disposition     ED Disposition  Admit   Condition  --   Comment  Hospital Area: Post [100100]  Level of Care: Telemetry Medical [104]  May admit patient to [facility] or [facility] if equivalent level of care is available:: No  Covid Evaluation: Asymptomatic - no recent exposure (last 10 days) testing not required  Diagnosis: [id number]  Admitting Physician: Velna Ochs [id number]  Attending Physician: Velna Ochs [id number]  Certification:: I certify this patient will need inpatient services for at least 2 midnights  Estimated Length of Stay: 2          B Medical/Surgery History History reviewed. No pertinent past medical history. History reviewed. No pertinent surgical history.   A IV Location/Drains/Wounds Patient Lines/Drains/Airways Status     Active Line/Drains/Airways     Name Placement date Placement time Site Days   Peripheral IV 11/29/22 Anterior;Proximal;Right Forearm 11/29/22  --  Forearm  less than 1   Wound 08/10/13 Abrasion(s) Nose 08/10/13  1912  Nose  3398            Intake/Output Last 24 hours No intake or output data in the 24 hours ending 11/29/22 1714  Labs/Imaging Results for  orders placed or performed during the hospital encounter of 11/29/22 (from the past 48 hour(s))  Basic metabolic panel     Status: Abnormal   Collection Time: 11/29/22 11:12 AM  Result Value Ref Range   Sodium 139 135 - 145 mmol/L   Potassium 3.9 3.5 - 5.1 mmol/L   Chloride 108 98 - 111 mmol/L   CO2 19 (L) 22 - 32 mmol/L   Glucose, Bld 96 70 - 99 mg/dL    Comment: Glucose reference range applies only to samples taken after fasting for at least 8 hours.   BUN 19 8 - 23 mg/dL   Creatinine, Ser 1.16 (H) 0.44 - 1.00 mg/dL   Calcium 9.2 8.9 - 10.3 mg/dL   GFR, Estimated 49 (L) >60 mL/min    Comment: (NOTE) Calculated using the CKD-EPI Creatinine Equation (2021)    Anion gap 12 5 - 15    Comment: Performed at [facility] [address] [place], [place] [address]  CBC with Differential     Status: Abnormal   Collection Time: 11/29/22 11:12 AM  Result Value Ref Range   WBC 16.0 (H) 4.0 - 10.5 K/uL   RBC 3.39 (L) 3.87 - 5.11 MIL/uL   Hemoglobin 12.6 12.0 - 15.0 g/dL   HCT 38.1 36.0 - 46.0 %   MCV 112.4 (H) 80.0 - 100.0 fL   MCH 37.2 (H) 26.0 - 34.0 pg   MCHC 33.1 30.0 - 36.0 g/dL   RDW 14.3 11.5 - 15.5 %  Platelets 215 150 - 400 K/uL   nRBC 0.0 0.0 - 0.2 %   Neutrophils Relative % 88 %   Neutro Abs 14.1 (H) 1.7 - 7.7 K/uL   Lymphocytes Relative 6 %   Lymphs Abs 0.9 0.7 - 4.0 K/uL   Monocytes Relative 6 %   Monocytes Absolute 0.9 0.1 - 1.0 K/uL   Eosinophils Relative 0 %   Eosinophils Absolute 0.0 0.0 - 0.5 K/uL   Basophils Relative 0 %   Basophils Absolute 0.0 0.0 - 0.1 K/uL   Immature Granulocytes 0 %   Abs Immature Granulocytes 0.05 0.00 - 0.07 K/uL    Comment: Performed at [facility] [address], [place], [place] [address]  Troponin I (High Sensitivity)     Status: Abnormal   Collection Time: 11/29/22 11:12 AM  Result Value Ref Range   Troponin I (High Sensitivity) 2,040 (HH) <18 ng/L    Comment: CRITICAL RESULT CALLED TO, READ BACK BY AND VERIFIED  WITH Mei Suits RN.@1304  ON 3.21.24 BY TCALDWELL MT. (NOTE) Elevated high sensitivity troponin I (hsTnI) values and significant  changes across serial measurements may suggest ACS but many other  chronic and acute conditions are known to elevate hsTnI results.  Refer to the "Links" section for chest pain algorithms and additional  guidance. Performed at [facility], [place] [address], [place], [place] [address]   Troponin I (High Sensitivity)     Status: Abnormal   Collection Time: 11/29/22 12:43 PM  Result Value Ref Range   Troponin I (High Sensitivity) 1,896 (HH) <18 ng/L    Comment: CRITICAL VALUE NOTED. VALUE IS CONSISTENT WITH PREVIOUSLY REPORTED/CALLED VALUE (NOTE) Elevated high sensitivity troponin I (hsTnI) values and significant  changes across serial measurements may suggest ACS but many other  chronic and acute conditions are known to elevate hsTnI results.  Refer to the "Links" section for chest pain algorithms and additional  guidance. Performed at [facility], [place] [address] [place], [place] [address]    MR BRAIN WO CONTRAST  Result Date: 11/29/2022 CLINICAL DATA:  Transient ischemic attack (TIA) EXAM: MRI HEAD WITHOUT CONTRAST TECHNIQUE: Multiplanar, multiecho pulse sequences of the brain and surrounding structures were obtained without intravenous contrast. COMPARISON:  CT head from the same day. 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 Mild associated edema without mass effect. No evidence of acute hemorrhage, mass lesion, midline shift or hydrocephalus. Remote left cerebellar infarct. Vascular: Major arterial flow voids are maintained at the skull base. Skull and upper cervical spine: Normal marrow signal. Sinuses/Orbits: Left maxillary sinus retention cysts. Otherwise, clear sinuses. No acute orbital findings. Other: No mastoid effusions. IMPRESSION: Acute right frontal cortical and subcortical infarct. Electronically Signed    By: Margaretha Sheffield M.D.   On: 11/29/2022 15:22   CT Cervical Spine Wo Contrast  Result Date: 11/29/2022 CLINICAL DATA:  Trauma EXAM: CT CERVICAL SPINE WITHOUT CONTRAST TECHNIQUE: Multidetector CT imaging of the cervical spine was performed without intravenous contrast. Multiplanar CT image reconstructions were also generated. RADIATION DOSE REDUCTION: This exam was performed according to the departmental dose-optimization program which includes automated exposure control, adjustment of the mA and/or kV according to patient size and/or use of iterative reconstruction technique. COMPARISON:  None Available. FINDINGS: Alignment: Normal. Skull base and vertebrae: No acute fracture. No primary bone lesion or focal pathologic process. Soft tissues and spinal canal: No prevertebral fluid or swelling. No visible canal hematoma. Disc levels: Disc space narrowing with marginal  osteophyte formation identified C5-6 and C6-7. Osteoarthritis identified at C1-C2. Upper chest: Negative. Other: None. IMPRESSION: Degenerative changes.  No acute traumatic abnormalities. Electronically Signed   By: Sammie Bench M.D.   On: 11/29/2022 12:33   CT Head Wo Contrast  Result Date: 11/29/2022 CLINICAL DATA:  Head trauma, moderate-severe EXAM: CT HEAD WITHOUT CONTRAST TECHNIQUE: Contiguous axial images were obtained from the base of the skull through the vertex without intravenous contrast. RADIATION DOSE REDUCTION: This exam was performed according to the departmental dose-optimization program which includes automated exposure control, adjustment of the mA and/or kV according to patient size and/or use of iterative reconstruction technique. COMPARISON:  None Available. FINDINGS: Brain: There is periventricular white matter decreased attenuation consistent with small vessel ischemic changes. Ventricles, sulci and cisterns are prominent consistent with age related involutional changes. No acute intracranial hemorrhage, mass effect  or shift. No hydrocephalus. Vascular: No hyperdense vessel or unexpected calcification. Skull: Normal. Negative for fracture or focal lesion. Sinuses/Orbits: No acute finding. IMPRESSION: Atrophy and chronic small vessel ischemic changes. No acute intracranial process identified. Electronically Signed   By: Sammie Bench M.D.   On: 11/29/2022 12:29   DG Pelvis Portable  Result Date: 11/29/2022 CLINICAL DATA:  Fall. EXAM: PORTABLE PELVIS 1-2 VIEWS COMPARISON:  None Available. FINDINGS: There is diffuse decreased bone mineralization. There are markedly severe degenerative changes of the superior left femoroacetabular joint with chronic moderate superior left femoral head and adjacent acetabular cortical erosion and bone remodeling with diffuse subchondral sclerosis and cystic change. 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 Mild to moderate superomedial right femoroacetabular joint space narrowing. There is oblique linear lucency within the left inferior pubic ramus suggesting an acute fracture with up to approximately 3 mm diastasis otherwise no significant displacement. No definitive additional pelvic fracture is visualized, however usually there is more than one pelvic ring fracture, and it is possible a second left superior pubic ramus or left acetabular fracture is not visualized on this limited single frontal view. IMPRESSION: 1. Acute minimally displaced fracture of the left inferior pubic ramus. 2. No definitive additional pelvic fracture is visualized, however usually there is more than one pelvic ring fracture, and it is possible a second left superior pubic ramus or left acetabular fracture is not visualized on this limited frontal view. 3. 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 Electronically Signed   By: Yvonne Kendall M.D.   On: 11/29/2022 11:55   DG Chest Portable 1 View  Result Date:  11/29/2022 CLINICAL DATA:  Fall. EXAM: PORTABLE CHEST 1 VIEW COMPARISON:  None Available. FINDINGS: Cardiac silhouette and mediastinal contours are within normal limits. Moderate calcification within the aortic arch. The patient is mildly rotated to the left. Within this limitation, no focal airspace opacity is seen. No pleural effusion pneumothorax. No definite acute displaced rib fracture is visualized. IMPRESSION: 1. No acute cardiopulmonary process. 2. No definite acute displaced rib fracture. Electronically Signed   By: Yvonne Kendall M.D.   On: 11/29/2022 11:36    Pending Labs Unresulted Labs (From admission, onward)     Start     Ordered   11/30/22 0500  Heparin level (unfractionated)  Daily,   R      11/29/22 1334   11/30/22 0500  CBC  Daily,   R      11/29/22 1334   11/30/22 [id number]  Basic metabolic panel  Tomorrow morning,   R  11/29/22 1713   11/29/22 2200  Heparin level (unfractionated)  Once-Timed,   URGENT        11/29/22 1334   11/29/22 1714  Hemoglobin A1c  Once,   R        11/29/22 1713   11/29/22 1710  Lipid panel  Once,   URGENT        11/29/22 1710   11/29/22 1043  Urinalysis, Routine w reflex microscopic -Urine, Clean Catch  Once,   URGENT       Question:  Specimen Source  Answer:  Urine, Clean Catch   11/29/22 1043            Vitals/Pain Today's Vitals   11/29/22 1415 11/29/22 1544 11/29/22 1700 11/29/22 1711  BP:  (!) 152/83 (!) 174/112   Pulse: 77 79 81 78  Resp: 12 18 15 17   Temp:  97.9 F (36.6 C)    TempSrc:  Oral    SpO2: 100% 100% 100% 100%    Isolation Precautions No active isolations  Medications Medications  heparin ADULT infusion 100 units/mL (25000 units/241mL) (750 Units/hr Intravenous New Bag/Given 11/29/22 1535)  aspirin EC tablet 81 mg (has no administration in time range)  Tdap (BOOSTRIX) injection 0.5 mL (0.5 mLs Intramuscular Given 11/29/22 1138)  sodium chloride 0.9 % bolus 500 mL (0 mLs Intravenous Stopped 11/29/22 1713)   lidocaine-EPINEPHrine (XYLOCAINE W/EPI) 2 %-1:200000 (PF) injection 10 mL (10 mLs Infiltration Given 11/29/22 1139)  heparin bolus via infusion 3,700 Units (3,700 Units Intravenous Bolus from Bag 11/29/22 1536)    Mobility walks with person assist     Focused Assessments    R Recommendations: See Admitting Provider Note  Report given to:   Additional Notes:

## 2022-11-29 NOTE — Progress Notes (Signed)
[place] for heparin Indication: chest pain/ACS and stroke  No Known Allergies  Patient Measurements: Height: 5' 10.5" (179.1 cm) Weight: 58.3 kg (128 lb 8.5 oz) IBW/kg (Calculated) : 69.65 Heparin Dosing Weight: 68 kg   Vital Signs: Temp: 99 F (37.2 C) (03/21 2011) Temp Source: Oral (03/21 2011) BP: 169/87 (03/21 2011) Pulse Rate: 76 (03/21 2011)  Labs: Recent Labs    11/29/22 1112 11/29/22 1243 11/29/22 2224  HGB 12.6  --   --   HCT 38.1  --   --   PLT 215  --   --   HEPARINUNFRC  --   --  0.29*  CREATININE 1.16*  --   --   TROPONINIHS 2,040* 1,896*  --     Estimated Creatinine Clearance: 38 mL/min (A) (by C-G formula based on SCr of 1.16 mg/dL (H)).  Medical History: History reviewed. No pertinent past medical history.  Medications:  Infusions:   Assessment: Wanda Howell is a 77 year old F admitted after a fall found to have elevated troponin at 2040. Patient reports no medications prior to admission. Patient found to have approximately 3 cm occipital scalp laceration with minimal bleeding. Will round heparin doses down due to bleed.  Heparin previously initiated utilizing ACS protocol. MRI revealing Acute right frontal cortical and subcortical infarct.   Hgb 12.6, plt 215.  3/21 PM update:  Heparin level just below goal  Goal of Therapy:  Heparin level 0.3-0.5 units/ml Monitor platelets by anticoagulation protocol: Yes   Plan:  Inc heparin to 800 units/hr Heparin level with AM labs  Narda Bonds, PharmD, [place] Pharmacist Phone: [phone]

## 2022-11-29 NOTE — Progress Notes (Signed)
ANTICOAGULATION CONSULT NOTE - Initial Consult  Pharmacy Consult for heparin Indication: chest pain/ACS  No Known Allergies  Patient Measurements:   Heparin Dosing Weight: 68 kg   Vital Signs: Temp: 98.6 F (37 C) (03/21 1113) Temp Source: Oral (03/21 1113) BP: 156/91 (03/21 1145) Pulse Rate: 77 (03/21 1145)  Labs: Recent Labs    11/29/22 1112  HGB 12.6  HCT 38.1  PLT 215  CREATININE 1.16*  TROPONINIHS 2,040*   CrCl cannot be calculated (Unknown ideal weight.).  Medical History: History reviewed. No pertinent past medical history.  Medications:  Infusions:   Assessment: Wanda Howell is a 77 year old F admitted after a fall found to have elevated troponin at 2040. Patient reports no medications prior to admission. Hgb 12.6, plt 215. Patient found to have approximately 3 cm occipital scalp laceration with minimal bleeding. Will round heparin doses down due to bleed.  Goal of Therapy:  Heparin level 0.3-0.7 units/ml Monitor platelets by anticoagulation protocol: Yes   Plan:  Heparin bolus 3700 units Start heparin 750 units/hr. Check 8 heparin level.  Daily CBC, heparin level. Monitor for signs/symptoms of bleeding.   Jeneen Rinks [id number] PM

## 2022-11-29 NOTE — ED Triage Notes (Signed)
Pt BIB [facility] from home d/t a ground level mechanical fall. Slipped and fell, no LOC, no blood thinner. Lac on th posterior part of the head. Bleeding controlled. A&O X4. Pt lives by herself And is compliant w her meds.

## 2022-11-30 ENCOUNTER — Other Ambulatory Visit (HOSPITAL_COMMUNITY): Payer: Self-pay

## 2022-11-30 ENCOUNTER — Inpatient Hospital Stay (HOSPITAL_COMMUNITY): Payer: Medicare Other

## 2022-11-30 DIAGNOSIS — I214 Non-ST elevation (NSTEMI) myocardial infarction: Secondary | ICD-10-CM

## 2022-11-30 DIAGNOSIS — S0101XA Laceration without foreign body of scalp, initial encounter: Secondary | ICD-10-CM

## 2022-11-30 DIAGNOSIS — R7989 Other specified abnormal findings of blood chemistry: Secondary | ICD-10-CM | POA: Diagnosis not present

## 2022-11-30 DIAGNOSIS — W19XXXA Unspecified fall, initial encounter: Secondary | ICD-10-CM

## 2022-11-30 DIAGNOSIS — I639 Cerebral infarction, unspecified: Secondary | ICD-10-CM | POA: Diagnosis not present

## 2022-11-30 HISTORY — DX: Other specified abnormal findings of blood chemistry: R79.89

## 2022-11-30 LAB — BASIC METABOLIC PANEL
Anion gap: 9 (ref 5–15)
BUN: 23 mg/dL (ref 8–23)
CO2: 19 mmol/L — ABNORMAL LOW (ref 22–32)
Calcium: 8.5 mg/dL — ABNORMAL LOW (ref 8.9–10.3)
Chloride: 112 mmol/L — ABNORMAL HIGH (ref 98–111)
Creatinine, Ser: 1.01 mg/dL — ABNORMAL HIGH (ref 0.44–1.00)
GFR, Estimated: 58 mL/min — ABNORMAL LOW (ref >60)
Glucose, Bld: 92 mg/dL (ref 70–99)
Potassium: 3.6 mmol/L (ref 3.5–5.1)
Sodium: 140 mmol/L (ref 135–145)

## 2022-11-30 LAB — URINALYSIS, ROUTINE W REFLEX MICROSCOPIC
Bacteria, UA: NONE SEEN
Bilirubin Urine: NEGATIVE
Glucose, UA: NEGATIVE mg/dL
Ketones, ur: 20 mg/dL — AB
Leukocytes,Ua: NEGATIVE
Nitrite: NEGATIVE
Protein, ur: NEGATIVE mg/dL
Specific Gravity, Urine: >1.046 — ABNORMAL HIGH (ref 1.005–1.030)
pH: 5 (ref 5.0–8.0)

## 2022-11-30 LAB — RAPID URINE DRUG SCREEN, HOSP PERFORMED
Amphetamines: NOT DETECTED
Barbiturates: NOT DETECTED
Benzodiazepines: NOT DETECTED
Cocaine: NOT DETECTED
Opiates: NOT DETECTED
Tetrahydrocannabinol: NOT DETECTED

## 2022-11-30 LAB — CBC
HCT: 34.3 % — ABNORMAL LOW (ref 36.0–46.0)
Hemoglobin: 11.4 g/dL — ABNORMAL LOW (ref 12.0–15.0)
MCH: 36.3 pg — ABNORMAL HIGH (ref 26.0–34.0)
MCHC: 33.2 g/dL (ref 30.0–36.0)
MCV: 109.2 fL — ABNORMAL HIGH (ref 80.0–100.0)
Platelets: 195 10*3/uL (ref 150–400)
RBC: 3.14 MIL/uL — ABNORMAL LOW (ref 3.87–5.11)
RDW: 14.4 % (ref 11.5–15.5)
WBC: 7.2 10*3/uL (ref 4.0–10.5)
nRBC: 0 % (ref 0.0–0.2)

## 2022-11-30 LAB — ECHOCARDIOGRAM COMPLETE
AR max vel: 2.7 cm2
AV Area VTI: 2.7 cm2
AV Area mean vel: 2.38 cm2
AV Mean grad: 3 mmHg
AV Peak grad: 6.3 mmHg
Ao pk vel: 1.25 m/s
Area-P 1/2: 5.38 cm2
Height: 70.5 in
S' Lateral: 2.6 cm
Weight: 2056.45 oz

## 2022-11-30 LAB — HEPARIN LEVEL (UNFRACTIONATED)
Heparin Unfractionated: 0.39 IU/mL (ref 0.30–0.70)
Heparin Unfractionated: 0.46 IU/mL (ref 0.30–0.70)

## 2022-11-30 LAB — HEMOGLOBIN A1C
Hgb A1c MFr Bld: 4.8 % (ref 4.8–5.6)
Mean Plasma Glucose: 91 mg/dL

## 2022-11-30 LAB — GLUCOSE, CAPILLARY
Glucose-Capillary: 87 mg/dL (ref 70–99)
Glucose-Capillary: 90 mg/dL (ref 70–99)

## 2022-11-30 MED ORDER — CLOPIDOGREL BISULFATE 75 MG PO TABS
75.0000 mg | ORAL_TABLET | Freq: Every day | ORAL | 0 refills | Status: DC
  Filled 2022-11-30: qty 30, 30d supply, fill #0

## 2022-11-30 MED ORDER — IOHEXOL 350 MG/ML SOLN
75.0000 mL | Freq: Once | INTRAVENOUS | Status: AC | PRN
  Administered 2022-11-30: 75 mL via INTRAVENOUS

## 2022-11-30 MED ORDER — ATORVASTATIN CALCIUM 40 MG PO TABS
40.0000 mg | ORAL_TABLET | Freq: Every day | ORAL | Status: DC
  Administered 2022-11-30 – 2022-12-01 (×2): 40 mg via ORAL
  Filled 2022-11-30 (×3): qty 1

## 2022-11-30 MED ORDER — ORAL CARE MOUTH RINSE: 15.0000 mL | OROMUCOSAL | Status: DC | PRN

## 2022-11-30 MED ORDER — ASPIRIN 81 MG PO TBEC
81.0000 mg | DELAYED_RELEASE_TABLET | Freq: Every day | ORAL | 0 refills | Status: DC
  Filled 2022-11-30: qty 30, 30d supply, fill #0

## 2022-11-30 MED ORDER — PERFLUTREN LIPID MICROSPHERE
1.0000 mL | INTRAVENOUS | Status: AC | PRN
  Administered 2022-11-30: 2 mL via INTRAVENOUS

## 2022-11-30 MED ORDER — ATORVASTATIN CALCIUM 40 MG PO TABS
40.0000 mg | ORAL_TABLET | Freq: Every day | ORAL | 0 refills | Status: DC
  Filled 2022-11-30: qty 30, 30d supply, fill #0

## 2022-11-30 NOTE — Progress Notes (Signed)
  Echocardiogram 2D Echocardiogram has been performed.  Wanda Howell 11/30/2022, 10:07 AM

## 2022-11-30 NOTE — Progress Notes (Signed)
Rounding Note    Patient Name: Wanda Howell Date of Encounter: 11/30/2022  [place] Cardiologist: Buford Dresser, MD   Subjective   Feeling better today, able to walk. No chest pain. Reviewed my interpretation of her echo, which is reassuring. We discussed cath given prior chest pain, elevated troponins. She is not interested.   Inpatient Medications    Scheduled Meds:  aspirin EC  81 mg Oral Daily   atorvastatin  40 mg Oral Daily   Gerhardt's butt cream   Topical BID   Continuous Infusions:  heparin 800 Units/hr (11/29/22 2343)   PRN Meds:    Vital Signs    Vitals:   11/30/22 1125 11/30/22 1211 11/30/22 1300 11/30/22 1307  BP: (!) 152/86 (!) 149/83  (!) 156/90  Pulse:  71 70   Resp:   20   Temp:    98 F (36.7 C)  TempSrc:    Oral  SpO2:  97% 97%   Weight:      Height:        Intake/Output Summary (Last 24 hours) at 11/30/2022 1549 Last data filed at 11/30/2022 0800 Gross per 24 hour  Intake --  Output 900 ml  Net -900 ml      11/29/2022    6:15 PM  Last 3 Weights  Weight (lbs) 128 lb 8.5 oz  Weight (kg) 58.3 kg      Telemetry    SR - Personally Reviewed  ECG    SR at 82 bpm with st depressions - Personally Reviewed  Physical Exam   GEN: No acute distress.   Neck: No JVD Cardiac: RRR, no murmurs, rubs, or gallops.  Respiratory: Clear to auscultation bilaterally. GI: Soft, nontender, non-distended  MS: No edema; No deformity. Neuro:  L facial droop Psych: Normal affect   Labs    High Sensitivity Troponin:   Recent Labs  Lab 11/29/22 1112 11/29/22 1243  TROPONINIHS 2,040* 1,896*     Chemistry Recent Labs  Lab 11/29/22 1112 11/30/22 0741  NA 139 140  K 3.9 3.6  CL 108 112*  CO2 19* 19*  GLUCOSE 96 92  BUN 19 23  CREATININE 1.16* 1.01*  CALCIUM 9.2 8.5*  GFRNONAA 49* 58*  ANIONGAP 12 9    Lipids  Recent Labs  Lab 11/29/22 1243  CHOL 190  TRIG 68  HDL 71  LDLCALC 105*  CHOLHDL 2.7     Hematology Recent Labs  Lab 11/29/22 1112 11/30/22 0741  WBC 16.0* 7.2  RBC 3.39* 3.14*  HGB 12.6 11.4*  HCT 38.1 34.3*  MCV 112.4* 109.2*  MCH 37.2* 36.3*  MCHC 33.1 33.2  RDW 14.3 14.4  PLT 215 195   Thyroid No results for input(s): "TSH", "FREET4" in the last 168 hours.  BNPNo results for input(s): "BNP", "PROBNP" in the last 168 hours.  DDimer No results for input(s): "DDIMER" in the last 168 hours.   Radiology    ECHOCARDIOGRAM COMPLETE  Result Date: 11/30/2022    ECHOCARDIOGRAM REPORT   Patient Name:   Wanda Howell [facility] Date of Exam: 11/30/2022 Medical Rec #:  [id number]      Height:       70.5 in Accession #:    [id number]     Weight:       128.5 lb Date of Birth:  [birth year]       BSA:          1.739 m Patient Age:    77 years  BP:           156/93 mmHg Patient Gender: F              HR:           70 bpm. Exam Location:  Inpatient Procedure: Intracardiac Opacification Agent, Cardiac Doppler, Color Doppler and            2D Echo Indications:   Elevated Troponin  History:       Patient has no prior history of Echocardiogram examinations.                NSTEMI.  Sonographer:   Marella Chimes Referring      [id number] Lily Kocher Phys:  Sonographer Comments: Technically difficult study due to poor echo windows. IMPRESSIONS  1. Left ventricular ejection fraction, by estimation, is 65 to 70%. The left ventricle has normal function. The left ventricle demonstrates grossly normal wall motion with contrast enhancement.  2. There is mild left ventricular hypertrophy. Left ventricular diastolic parameters were normal.  3. Right ventricular systolic function is normal. The right ventricular size is normal.  4. The mitral valve is abnormal. Trivial mitral valve regurgitation.  5. The aortic valve was not well visualized. Aortic valve regurgitation is not visualized. Aortic valve sclerosis is present, with no evidence of aortic valve stenosis.  6. The inferior vena cava is normal in size with greater  than 50% respiratory variability, suggesting right atrial pressure of 3 mmHg. Comparison(s): No prior Echocardiogram. FINDINGS  Left Ventricle: Left ventricular ejection fraction, by estimation, is 65 to 70%. The left ventricle has normal function. The left ventricle has no regional wall motion abnormalities. Definity contrast agent was given IV to delineate the left ventricular  endocardial borders. The left ventricular internal cavity size was normal in size. There is mild left ventricular hypertrophy. Left ventricular diastolic parameters were normal. Right Ventricle: The right ventricular size is normal. No increase in right ventricular wall thickness. Right ventricular systolic function is normal. Left Atrium: Left atrial size was normal in size. Right Atrium: Right atrial size was normal in size. Pericardium: There is no evidence of pericardial effusion. Mitral Valve: The mitral valve is abnormal. Mild mitral annular calcification. Trivial mitral valve regurgitation. Tricuspid Valve: The tricuspid valve is grossly normal. Tricuspid valve regurgitation is trivial. Aortic Valve: The aortic valve was not well visualized. Aortic valve regurgitation is not visualized. Aortic valve sclerosis is present, with no evidence of aortic valve stenosis. Aortic valve mean gradient measures 3.0 mmHg. Aortic valve peak gradient measures 6.2 mmHg. Aortic valve area, by VTI measures 2.70 cm. Pulmonic Valve: The pulmonic valve was normal in structure. Pulmonic valve regurgitation is not visualized. Aorta: The aortic root and ascending aorta are structurally normal, with no evidence of dilitation. Venous: The inferior vena cava is normal in size with greater than 50% respiratory variability, suggesting right atrial pressure of 3 mmHg. IAS/Shunts: No atrial level shunt detected by color flow Doppler.  LEFT VENTRICLE PLAX 2D LVIDd:         3.90 cm   Diastology LVIDs:         2.60 cm   LV e' medial:    7.18 cm/s LV PW:         1.20  cm   LV E/e' medial:  7.3 LV IVS:        1.10 cm   LV e' lateral:   8.27 cm/s LVOT diam:     1.90 cm   LV E/e' lateral:  6.4 LV SV:         62 LV SV Index:   35 LVOT Area:     2.84 cm  RIGHT VENTRICLE RV S prime:     13.80 cm/s TAPSE (M-mode): 1.1 cm LEFT ATRIUM             Index        RIGHT ATRIUM           Index LA Vol (A2C):   48.4 ml 27.83 ml/m  RA Area:     11.00 cm LA Vol (A4C):   55.8 ml 32.09 ml/m  RA Volume:   22.60 ml  13.00 ml/m LA Biplane Vol: 56.2 ml 32.32 ml/m  AORTIC VALVE AV Area (Vmax):    2.70 cm AV Area (Vmean):   2.38 cm AV Area (VTI):     2.70 cm AV Vmax:           125.00 cm/s AV Vmean:          72.100 cm/s AV VTI:            0.228 m AV Peak Grad:      6.2 mmHg AV Mean Grad:      3.0 mmHg LVOT Vmax:         119.00 cm/s LVOT Vmean:        60.500 cm/s LVOT VTI:          0.217 m LVOT/AV VTI ratio: 0.95  AORTA Ao Root diam: 3.40 cm Ao Asc diam:  3.50 cm MITRAL VALVE MV Area (PHT): 5.38 cm    SHUNTS MV Decel Time: 141 msec    Systemic VTI:  0.22 m MV E velocity: 52.70 cm/s  Systemic Diam: 1.90 cm MV A velocity: 40.30 cm/s MV E/A ratio:  1.31 Lyman Bishop MD Electronically signed by Lyman Bishop MD Signature Date/Time: 11/30/2022/2:38:42 PM    Final    CT ANGIO HEAD NECK W WO CM  Result Date: 11/30/2022 CLINICAL DATA:  Stroke/TIA, determine embolic source. EXAM: CT HEAD WITHOUT CONTRAST CT ANGIOGRAPHY OF THE HEAD AND NECK TECHNIQUE: Contiguous axial images were obtained from the base of the skull through the vertex without intravenous contrast. Multidetector CT imaging of the head and neck was performed using the standard protocol during bolus administration of intravenous contrast. Multiplanar CT image reconstructions and MIPs were obtained to evaluate the vascular anatomy. Carotid stenosis measurements (when applicable) are obtained utilizing NASCET criteria, using the distal internal carotid diameter as the denominator. RADIATION DOSE REDUCTION: This exam was performed according to  the departmental dose-optimization program which includes automated exposure control, adjustment of the mA and/or kV according to patient size and/or use of iterative reconstruction technique. CONTRAST:  72mL OMNIPAQUE IOHEXOL 350 MG/ML SOLN COMPARISON:  MRI brain 11/29/2022. FINDINGS: CT HEAD Brain: No acute intracranial hemorrhage. Evolving infarct along the right precentral gyrus and premotor area. Beam hardening artifact along the left anterior temporal lobe. Chronic small-vessel disease. No hydrocephalus or extra-axial collection. No mass effect or midline shift. Vascular: No hyperdense vessel or unexpected calcification. Skull: Normal. Negative for fracture or focal lesion. Sinuses/Orbits: Mild mucosal disease in the left maxillary sinus. CTA NECK Aortic arch: Three-vessel arch configuration. Arch vessel origins are patent. Right carotid system: The common and internal carotid arteries are patent to the skull base without stenosis, aneurysm or dissection. Left carotid system: The common and internal carotid arteries are patent to the skull base without stenosis, aneurysm or dissection. Vertebral arteries:Patent from the origin to the confluence  with the basilar without stenosis or dissection. Skeleton: No suspicious bone lesions. Other neck: 11 mm predominantly hypoattenuating nodule in the left thyroid lobe with central calcification (No follow-up imaging is recommended). CTA HEAD Anterior circulation: Calcified plaque along the carotid siphons without hemodynamically significant stenosis. The proximal ACAs and MCAs are patent without stenosis or aneurysm. Distal branches are symmetric. Posterior circulation: Normal basilar artery. The SCAs, AICAs and PICAs are patent proximally. The PCAs are patent proximally without stenosis or aneurysm. Distal branches are symmetric. Venous sinuses: Patent. Anatomic variants: None. IMPRESSION: 1. Evolving infarct along the right precentral gyrus and premotor area. No acute  intracranial hemorrhage. 2. No large vessel occlusion or hemodynamically significant stenosis in the head or neck. Electronically Signed   By: Emmit Alexanders M.D.   On: 11/30/2022 14:15   MR BRAIN WO CONTRAST  Result Date: 11/29/2022 CLINICAL DATA:  Transient ischemic attack (TIA) EXAM: MRI HEAD WITHOUT CONTRAST TECHNIQUE: Multiplanar, multiecho pulse sequences of the brain and surrounding structures were obtained without intravenous contrast. COMPARISON:  CT head from the same day. 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 Mild associated edema without mass effect. No evidence of acute hemorrhage, mass lesion, midline shift or hydrocephalus. Remote left cerebellar infarct. Vascular: Major arterial flow voids are maintained at the skull base. Skull and upper cervical spine: Normal marrow signal. Sinuses/Orbits: Left maxillary sinus retention cysts. Otherwise, clear sinuses. No acute orbital findings. Other: No mastoid effusions. IMPRESSION: Acute right frontal cortical and subcortical infarct. Electronically Signed   By: Margaretha Sheffield M.D.   On: 11/29/2022 15:22   CT Cervical Spine Wo Contrast  Result Date: 11/29/2022 CLINICAL DATA:  Trauma EXAM: CT CERVICAL SPINE WITHOUT CONTRAST TECHNIQUE: Multidetector CT imaging of the cervical spine was performed without intravenous contrast. Multiplanar CT image reconstructions were also generated. RADIATION DOSE REDUCTION: This exam was performed according to the departmental dose-optimization program which includes automated exposure control, adjustment of the mA and/or kV according to patient size and/or use of iterative reconstruction technique. COMPARISON:  None Available. FINDINGS: Alignment: Normal. Skull base and vertebrae: No acute fracture. No primary bone lesion or focal pathologic process. Soft tissues and spinal canal: No prevertebral fluid or swelling. No visible canal hematoma. Disc levels: Disc space narrowing with  marginal osteophyte formation identified C5-6 and C6-7. Osteoarthritis identified at C1-C2. Upper chest: Negative. Other: None. IMPRESSION: Degenerative changes.  No acute traumatic abnormalities. Electronically Signed   By: Sammie Bench M.D.   On: 11/29/2022 12:33   CT Head Wo Contrast  Result Date: 11/29/2022 CLINICAL DATA:  Head trauma, moderate-severe EXAM: CT HEAD WITHOUT CONTRAST TECHNIQUE: Contiguous axial images were obtained from the base of the skull through the vertex without intravenous contrast. RADIATION DOSE REDUCTION: This exam was performed according to the departmental dose-optimization program which includes automated exposure control, adjustment of the mA and/or kV according to patient size and/or use of iterative reconstruction technique. COMPARISON:  None Available. FINDINGS: Brain: There is periventricular white matter decreased attenuation consistent with small vessel ischemic changes. Ventricles, sulci and cisterns are prominent consistent with age related involutional changes. No acute intracranial hemorrhage, mass effect or shift. No hydrocephalus. Vascular: No hyperdense vessel or unexpected calcification. Skull: Normal. Negative for fracture or focal lesion. Sinuses/Orbits: No acute finding. IMPRESSION: Atrophy and chronic small vessel ischemic changes. No acute intracranial process identified. Electronically Signed   By: Sammie Bench M.D.   On: 11/29/2022 12:29   DG Pelvis Portable  Result Date: 11/29/2022 CLINICAL DATA:  Fall. EXAM: PORTABLE PELVIS 1-2 VIEWS COMPARISON:  None Available. FINDINGS: There is diffuse decreased bone mineralization. There are markedly severe degenerative changes of the superior left femoroacetabular joint with chronic moderate superior left femoral head and adjacent acetabular cortical erosion and bone remodeling with diffuse subchondral sclerosis and cystic change. 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  Mild to moderate superomedial right femoroacetabular joint space narrowing. There is oblique linear lucency within the left inferior pubic ramus suggesting an acute fracture with up to approximately 3 mm diastasis otherwise no significant displacement. No definitive additional pelvic fracture is visualized, however usually there is more than one pelvic ring fracture, and it is possible a second left superior pubic ramus or left acetabular fracture is not visualized on this limited single frontal view. IMPRESSION: 1. Acute minimally displaced fracture of the left inferior pubic ramus. 2. No definitive additional pelvic fracture is visualized, however usually there is more than one pelvic ring fracture, and it is possible a second left superior pubic ramus or left acetabular fracture is not visualized on this limited frontal view. 3. 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 Electronically Signed   By: Yvonne Kendall M.D.   On: 11/29/2022 11:55   DG Chest Portable 1 View  Result Date: 11/29/2022 CLINICAL DATA:  Fall. EXAM: PORTABLE CHEST 1 VIEW COMPARISON:  None Available. FINDINGS: Cardiac silhouette and mediastinal contours are within normal limits. Moderate calcification within the aortic arch. The patient is mildly rotated to the left. Within this limitation, no focal airspace opacity is seen. No pleural effusion pneumothorax. No definite acute displaced rib fracture is visualized. IMPRESSION: 1. No acute cardiopulmonary process. 2. No definite acute displaced rib fracture. Electronically Signed   By: Yvonne Kendall M.D.   On: 11/29/2022 11:36    Cardiac Studies   Echo 11/30/22  1. Left ventricular ejection fraction, by estimation, is 65 to 70%. The  left ventricle has normal function. The left ventricle demonstrates  grossly normal wall motion with contrast enhancement.   2. There is mild left ventricular hypertrophy. Left  ventricular diastolic  parameters were normal.   3. Right ventricular systolic function is normal. The right ventricular  size is normal.   4. The mitral valve is abnormal. Trivial mitral valve regurgitation.   5. The aortic valve was not well visualized. Aortic valve regurgitation  is not visualized. Aortic valve sclerosis is present, with no evidence of  aortic valve stenosis.   6. The inferior vena cava is normal in size with greater than 50%  respiratory variability, suggesting right atrial pressure of 3 mmHg   Patient Profile     77 y.o. female no significant known PMH (has not sought medical care in many years), presenting with neurologic changes. Cardiology asked to see for elevated troponin   Assessment & Plan    Acute CVA -outside the window for tpa -on aspirin, statin -per neuro  Elevated troponin Chest pain day prior to presentation -continue heparin for a total of 48 hours -aspirin, statin as above -echo reassuring -discussed cath for evaluation of CAD. She does not wish to pursue invasive measures  [facility] will sign off.   Medication Recommendations:  aspirin, statin, clopidogrel per neurology Other recommendations (labs, testing, etc):  none Follow up as an outpatient:  We will arrange for her to follow up with me after discharge.  For questions or updates, please contact [place] Please consult  [email or web address] for contact info under        Signed, Buford Dresser, MD  11/30/2022, 3:49 PM

## 2022-11-30 NOTE — Plan of Care (Signed)
  Problem: Education: Goal: Knowledge of General Education information will improve Description: Including pain rating scale, medication(s)/side effects and non-pharmacologic comfort measures Outcome: Progressing   Problem: Clinical Measurements: Goal: Respiratory complications will improve Outcome: Progressing   Problem: Activity: Goal: Risk for activity intolerance will decrease Outcome: Progressing   Problem: Nutrition: Goal: Adequate nutrition will be maintained Outcome: Progressing   Problem: Coping: Goal: Level of anxiety will decrease Outcome: Progressing   Problem: Skin Integrity: Goal: Risk for impaired skin integrity will decrease Outcome: Progressing

## 2022-11-30 NOTE — Progress Notes (Signed)
Patient back to the unit, Vital signs stable

## 2022-11-30 NOTE — Progress Notes (Signed)
ANTICOAGULATION CONSULT NOTE   Pharmacy Consult for heparin Indication: chest pain/ACS and stroke  No Known Allergies  Patient Measurements: Height: 5' 10.5" (179.1 cm) Weight: 58.3 kg (128 lb 8.5 oz) IBW/kg (Calculated) : 69.65 Heparin Dosing Weight: 68 kg   Vital Signs: Temp: 97.9 F (36.6 C) (03/22 1621) Temp Source: Axillary (03/22 1621) BP: 178/104 (03/22 1621) Pulse Rate: 85 (03/22 1621)  Labs: Recent Labs    11/29/22 1112 11/29/22 1243 11/29/22 2224 11/30/22 0741 11/30/22 1558  HGB 12.6  --   --  11.4*  --   HCT 38.1  --   --  34.3*  --   PLT 215  --   --  195  --   HEPARINUNFRC  --   --  0.29* 0.39 0.46  CREATININE 1.16*  --   --  1.01*  --   TROPONINIHS 2,040* 1,896*  --   --   --     Estimated Creatinine Clearance: 43.6 mL/min (A) (by C-G formula based on SCr of 1.01 mg/dL (H)).   Assessment: Wanda Howell is a 77 year old F admitted after a fall found to have elevated troponin at 2040. Patient reports no medications prior to admission. Patient found to have approximately 3 cm occipital scalp laceration with minimal bleeding. Will round heparin doses down due to bleed.  Heparin previously initiated utilizing ACS protocol. MRI revealing Acute right frontal cortical and subcortical infarct.   Evening heparin level therapeutic   Goal of Therapy:  Heparin level 0.3-0.5 units/ml Monitor platelets by anticoagulation protocol: Yes   Plan:  Continue heparin infusion at 800 units/hr Check heparin level daily while on heparin Continue to monitor H&H and platelets  Thank you Anette Guarneri, PharmD

## 2022-11-30 NOTE — Evaluation (Signed)
Physical Therapy Evaluation Patient Details Name: Wanda Howell MRN: [id number] DOB: 1946-01-27 Today's Date: 11/30/2022  History of Present Illness  Pt presented to the ED 3/21 with c/o fall, L weakness, and slurred speech. She also reports experiencing burning chest pain the day prior. Pt struck her head when she fell, and also reported L hip/pelvic pain. Lab revealed elevated trop, dx with NSTEMI. MRI revealed R frontal infarct. Xray revealed L inferior pubic ramus fx. PMH: unknown   Clinical Impression  Pt admitted with above diagnosis. PTA pt lived alone in apartment, independent mobility and ADLs. Pt currently with functional limitations due to the deficits listed below (see PT Problem List). On eval, pt required min guard assist transfers and ambulation 15' with RW. Gait distance limited by L groin/pelvic pain and elevated BP. Pt very frustrated regarding continual questioning by staff/multiple disciplines. She is very anxious to return home. She reports friends will by able to assist with grocery shopping. Pt will benefit from acute skilled PT to increase their independence and safety with mobility to allow discharge.          Recommendations for follow up therapy are one component of a multi-disciplinary discharge planning process, led by the attending physician.  Recommendations may be updated based on patient status, additional functional criteria and insurance authorization.  Follow Up Recommendations Home health PT      Assistance Recommended at Discharge Intermittent Supervision/Assistance  Patient can return home with the following  Assist for transportation;Assistance with cooking/housework    Equipment Recommendations Rolling walker (2 wheels)  Recommendations for Other Services       Functional Status Assessment Patient has had a recent decline in their functional status and demonstrates the ability to make significant improvements in function in a reasonable and  predictable amount of time.     Precautions / Restrictions Precautions Precautions: Fall Restrictions Weight Bearing Restrictions: No      Mobility  Bed Mobility Overal bed mobility: Modified Independent             General bed mobility comments: +rail, increased time and effort. Declining physical assist.    Transfers Overall transfer level: Needs assistance Equipment used: Rolling walker (2 wheels) Transfers: Sit to/from Stand Sit to Stand: Min guard, From elevated surface           General transfer comment: cues for hand placement, increased time to power up    Ambulation/Gait Ambulation/Gait assistance: Min guard Gait Distance (Feet): 15 Feet Assistive device: Rolling walker (2 wheels) Gait Pattern/deviations: Step-through pattern, Decreased stride length, Antalgic Gait velocity: decreased Gait velocity interpretation: <1.8 ft/sec, indicate of risk for recurrent falls   General Gait Details: steady gait with RW. Limited distance due to pain and increased BP.  Stairs            Wheelchair Mobility    Modified Rankin (Stroke Patients Only) Modified Rankin (Stroke Patients Only) Pre-Morbid Rankin Score: No significant disability Modified Rankin: Moderately severe disability     Balance Overall balance assessment: Needs assistance Sitting-balance support: Feet supported, No upper extremity supported Sitting balance-Leahy Scale: Good     Standing balance support: Bilateral upper extremity supported, During functional activity, Reliant on assistive device for balance Standing balance-Leahy Scale: Poor                               Pertinent Vitals/Pain Pain Assessment Pain Assessment: Faces Faces Pain Scale: Hurts little more Pain Location:  L groin with WB Pain Descriptors / Indicators: Discomfort Pain Intervention(s): Monitored during session, Repositioned, Limited activity within patient's tolerance    Home Living  Family/patient expects to be discharged to:: Private residence Living Arrangements: Alone Available Help at Discharge: Friend(s);Available PRN/intermittently Type of Home: Apartment Home Access: Elevator       Home Layout: One level Home Equipment: Cane - single point Additional Comments: reports apartment will install grab bars in bathroom if needed    Prior Function Prior Level of Function : Independent/Modified Independent;Driving             Mobility Comments: walks to grocery store       Hand Dominance        Extremity/Trunk Assessment   Upper Extremity Assessment Upper Extremity Assessment: Defer to OT evaluation    Lower Extremity Assessment Lower Extremity Assessment: LLE deficits/detail LLE Deficits / Details: 4/5 grossly graded. Light touch intact.    Cervical / Trunk Assessment Cervical / Trunk Assessment: Kyphotic;Other exceptions Cervical / Trunk Exceptions: scoliosis  Communication   Communication: No difficulties  Cognition Arousal/Alertness: Awake/alert Behavior During Therapy: Anxious (easily frustrated) Overall Cognitive Status: Within Functional Limits for tasks assessed                                          General Comments General comments (skin integrity, edema, etc.): BP 180/96 sitting EOB    Exercises     Assessment/Plan    PT Assessment Patient needs continued PT services  PT Problem List Decreased strength;Decreased balance;Decreased mobility;Decreased knowledge of use of DME;Decreased activity tolerance       PT Treatment Interventions DME instruction;Functional mobility training;Balance training;Patient/family education;Gait training;Therapeutic activities;Therapeutic exercise    PT Goals (Current goals can be found in the Care Plan section)  Acute Rehab PT Goals Patient Stated Goal: home PT Goal Formulation: With patient Time For Goal Achievement: 12/14/22 Potential to Achieve Goals: Good     Frequency Min 4X/week     Co-evaluation               AM-PAC PT "6 Clicks" Mobility  Outcome Measure Help needed turning from your back to your side while in a flat bed without using bedrails?: None Help needed moving from lying on your back to sitting on the side of a flat bed without using bedrails?: A Little Help needed moving to and from a bed to a chair (including a wheelchair)?: A Little Help needed standing up from a chair using your arms (e.g., wheelchair or bedside chair)?: A Little Help needed to walk in hospital room?: A Little Help needed climbing 3-5 steps with a railing? : A Lot 6 Click Score: 18    End of Session Equipment Utilized During Treatment: Gait belt Activity Tolerance: Patient tolerated treatment well Patient left: in bed;with call bell/phone within reach;with family/visitor present;with bed alarm set Nurse Communication: Mobility status;Precautions PT Visit Diagnosis: Difficulty in walking, not elsewhere classified (R26.2);Muscle weakness (generalized) (M62.81);Other abnormalities of gait and mobility (R26.89)    Time: [id number] PT Time Calculation (min) (ACUTE ONLY): 26 min   Charges:   PT Evaluation $PT Eval Moderate Complexity: 1 Mod PT Treatments $Gait Training: 8-22 mins        Gloriann Loan., PT  Office # [phone]   Lorriane Shire 11/30/2022, 12:16 PM

## 2022-11-30 NOTE — Progress Notes (Addendum)
Subjective:   Summary: Wanda Howell is a 77 y.o. year old female currently admitted on the [facility] HD#1 for acute CVA.  Overnight Events: NAEON   She is feeling fine overnight, but wishes she could have gotten more sleep. She is eager to go home, and overwhelmed with all the repetitive questions from different specialists but understands the utility of being asked. She has no chest pain or SOB.   Of note, she mentioned  L cheek plastic surgery in the past, which has always been more swollen than the R.  Objective:  Vital signs in last 24 hours: Vitals:   11/29/22 2011 11/29/22 2350 11/30/22 0340 11/30/22 0700  BP: (!) 169/87 (!) 159/91 (!) 145/88 (!) 174/92  Pulse: 76 84 73 74  Resp: 17 17 17 15   Temp: 99 F (37.2 C) 98.2 F (36.8 C) 98.2 F (36.8 C) 98.1 F (36.7 C)  TempSrc: Oral Oral Oral Oral  SpO2: 98% 98% 100% 100%  Weight:      Height:       Supplemental O2: Room Air SpO2: 100 %   Physical Exam:  Patient declined physical exam. Was able to convince her to allow neurology to do a neuro exam. Allowed IM attending to do cardio exam.   Constitutional: chronically-ill appearing laying in bed in no acute distress Cardiovascular: RRR, no murmurs, rubs or gallops Pulmonary/Chest: normal work of breathing on room air  [employer]   11/29/22 1815  Weight: 58.3 kg     Intake/Output Summary (Last 24 hours) at 11/30/2022 0816 Last data filed at 11/29/2022 2343 Gross per 24 hour  Intake --  Output 600 ml  Net -600 ml   Net IO Since Admission: -600 mL [11/30/22 0816]  Pertinent Labs:    Latest Ref Rng & Units 11/30/2022    7:41 AM 11/29/2022   11:12 AM  CBC  WBC 4.0 - 10.5 K/uL 7.2  16.0   Hemoglobin 12.0 - 15.0 g/dL 11.4  12.6   Hematocrit 36.0 - 46.0 % 34.3  38.1   Platelets 150 - 400 K/uL 195  215        Latest Ref Rng & Units 11/29/2022   11:12 AM  CMP  Glucose 70 - 99 mg/dL 96   BUN 8 - 23 mg/dL 19   Creatinine 0.44 - 1.00 mg/dL  1.16   Sodium 135 - 145 mmol/L 139   Potassium 3.5 - 5.1 mmol/L 3.9   Chloride 98 - 111 mmol/L 108   CO2 22 - 32 mmol/L 19   Calcium 8.9 - 10.3 mg/dL 9.2     I personally reviewed interval labs and pertinent results include: anemia, leukocytosis, elevated Cr, mild alkalosis.   Assessment/Plan:   Principal Problem:   Acute CVA (cerebrovascular accident) [facility]) Active Problems:   NSTEMI (non-ST elevated myocardial infarction) ([place])   Fracture of ramus of left pubis [facility])   Patient Summary: Wanda Howell is a 77 y.o. with an unknown PMHx who presented with head injury s/p fall from standing and admitted for acute CVA and NSTEMI.   #Acute CVA Her MRI results from 3/20 demonstrated an acute right frontal cortical and subcortical infarct. We ordered an H1AC and lipid panel to ascertain if her risk of atherosclerosis as a potential contributor to her CVA and NSTEMI. Her H1AC is normal at 4.8, she does not have DM. Her lipid panel demonstrated normal  HDL and Cholesterol, with elevated LDL (105). She was started on Crestor 40mg  as a preventative measure. She is also being anticoagulated with a heparin drip, but requested its removal because she cannot sleep well without bending her arm. She appears alert and oriented at bedside with a left facial droop. She is hesitant to do PT because of left hip pain, but is willing to try today.  - appreciate neuro recs - monitor telemetry - continue heparin drip pending Echo results - ASA 81 mg, Crestor 20mg    #NSTEMI She denies any chest pain after this episode on Wednesday 3/20. Her heart rate is regular. She is eager to get out of the hospital soon. - Pending Echocardiogram.  - f/u CT Angio Head and Neck    #Lacerated head wound Her head wound was sutured in the ED.  #AKI Her creatinine is elevated at 1.16, but unsure of her baseline because she has not visited a doctor in decades. She does not have any electrolyte abnormalities, but will  continue to monitor with BMP.   #Left Pubic Ramus Fracture #Osteoporosis Patient is hesitant to do PT, but will agree to a short walk. In a patient who is postmenopausal, a fall from standing height is concerning for a diagnosis of osteoporosis. Would recommend bisphonates in outpatient follow up.    #Suspected Venous Stasis Patient reports that she has pain in her legs when she stands still on her feet. On exam, she has dark purple discoloration in her lower extremities that is c/f venous stasis especially for a patient who has not seen a doctor in decades. Was unable to assess blood flow due to patient declining physical exam. Recommend outpatient follow up and a doppler US to assess blood flow.    #Chronic Right Toe fracture Patient says her toe had been broken for years, but started to fall off more in the recent months. No acute concerns at this time, but she could benefit from outpatient follow up as a primary prevention to infection.  #Anemia Patient's hemoglobin decreased to 11.4. She is currently stable and do not anticipate an acute need for transfusion unless her Hgb <7.    Diet: Normal IVF: None VTE: Heparin Code: DNR   Margy Clarks Medical Student

## 2022-11-30 NOTE — TOC Transition Note (Signed)
Discharge medications (3) are being stored in the main pharmacy on the ground floor until patient is ready for discharge.   

## 2022-11-30 NOTE — Progress Notes (Addendum)
STROKE TEAM PROGRESS NOTE   INTERVAL HISTORY No family at the bedside.  RN at the bedside.  Patient states she is going home tomorrow and does not want to completely participate in exam.  She states her left side weakness is resolved and it only lasted about 15 minutes She is alert and oriented x 3,  moving all extremities equally and symmetrically, pupils equal and reactive, tracks, visual fields full, left facial droop is present however she tells me that is old due to plastic surgery she had years ago, no slurred speech or dysphagia We are recommending 30-day monitoring and or a loop and for her to follow-up with cards  Vitals:   11/30/22 1125 11/30/22 1211 11/30/22 1300 11/30/22 1307  BP: (!) 152/86 (!) 149/83  (!) 156/90  Pulse:  71 70   Resp:   20   Temp:    98 F (36.7 C)  TempSrc:    Oral  SpO2:  97% 97%   Weight:      Height:       CBC:  Recent Labs  Lab 11/29/22 1112 11/30/22 0741  WBC 16.0* 7.2  NEUTROABS 14.1*  --   HGB 12.6 11.4*  HCT 38.1 34.3*  MCV 112.4* 109.2*  PLT 215 0000000   Basic Metabolic Panel:  Recent Labs  Lab 11/29/22 1112 11/30/22 0741  NA 139 140  K 3.9 3.6  CL 108 112*  CO2 19* 19*  GLUCOSE 96 92  BUN 19 23  CREATININE 1.16* 1.01*  CALCIUM 9.2 8.5*   Lipid Panel:  Recent Labs  Lab 11/29/22 1243  CHOL 190  TRIG 68  HDL 71  CHOLHDL 2.7  VLDL 14  LDLCALC 105*   HgbA1c:  Recent Labs  Lab 11/29/22 1831  HGBA1C 4.8   Urine Drug Screen: No results for input(s): "LABOPIA", "COCAINSCRNUR", "LABBENZ", "AMPHETMU", "THCU", "LABBARB" in the last 168 hours.  Alcohol Level No results for input(s): "ETH" in the last 168 hours.  IMAGING past 24 hours ECHOCARDIOGRAM COMPLETE  Result Date: 11/30/2022    ECHOCARDIOGRAM REPORT   Patient Name:   Wanda Howell [facility] Date of Exam: 11/30/2022 Medical Rec #:  [id number]      Height:       70.5 in Accession #:    [id number]     Weight:       128.5 lb Date of Birth:  [birth year]       BSA:          1.739 m  Patient Age:    77 years       BP:           156/93 mmHg Patient Gender: F              HR:           70 bpm. Exam Location:  Inpatient Procedure: Intracardiac Opacification Agent, Cardiac Doppler, Color Doppler and            2D Echo Indications:   Elevated Troponin  History:       Patient has no prior history of Echocardiogram examinations.                NSTEMI.  Sonographer:   Marella Chimes Referring      [id number] Lily Kocher Phys:  Sonographer Comments: Technically difficult study due to poor echo windows. IMPRESSIONS  1. Left ventricular ejection fraction, by estimation, is 65 to 70%. The left ventricle has normal function. The left ventricle demonstrates grossly normal  wall motion with contrast enhancement.  2. There is mild left ventricular hypertrophy. Left ventricular diastolic parameters were normal.  3. Right ventricular systolic function is normal. The right ventricular size is normal.  4. The mitral valve is abnormal. Trivial mitral valve regurgitation.  5. The aortic valve was not well visualized. Aortic valve regurgitation is not visualized. Aortic valve sclerosis is present, with no evidence of aortic valve stenosis.  6. The inferior vena cava is normal in size with greater than 50% respiratory variability, suggesting right atrial pressure of 3 mmHg. Comparison(s): No prior Echocardiogram. FINDINGS  Left Ventricle: Left ventricular ejection fraction, by estimation, is 65 to 70%. The left ventricle has normal function. The left ventricle has no regional wall motion abnormalities. Definity contrast agent was given IV to delineate the left ventricular  endocardial borders. The left ventricular internal cavity size was normal in size. There is mild left ventricular hypertrophy. Left ventricular diastolic parameters were normal. Right Ventricle: The right ventricular size is normal. No increase in right ventricular wall thickness. Right ventricular systolic function is normal. Left Atrium: Left atrial  size was normal in size. Right Atrium: Right atrial size was normal in size. Pericardium: There is no evidence of pericardial effusion. Mitral Valve: The mitral valve is abnormal. Mild mitral annular calcification. Trivial mitral valve regurgitation. Tricuspid Valve: The tricuspid valve is grossly normal. Tricuspid valve regurgitation is trivial. Aortic Valve: The aortic valve was not well visualized. Aortic valve regurgitation is not visualized. Aortic valve sclerosis is present, with no evidence of aortic valve stenosis. Aortic valve mean gradient measures 3.0 mmHg. Aortic valve peak gradient measures 6.2 mmHg. Aortic valve area, by VTI measures 2.70 cm. Pulmonic Valve: The pulmonic valve was normal in structure. Pulmonic valve regurgitation is not visualized. Aorta: The aortic root and ascending aorta are structurally normal, with no evidence of dilitation. Venous: The inferior vena cava is normal in size with greater than 50% respiratory variability, suggesting right atrial pressure of 3 mmHg. IAS/Shunts: No atrial level shunt detected by color flow Doppler.  LEFT VENTRICLE PLAX 2D LVIDd:         3.90 cm   Diastology LVIDs:         2.60 cm   LV e' medial:    7.18 cm/s LV PW:         1.20 cm   LV E/e' medial:  7.3 LV IVS:        1.10 cm   LV e' lateral:   8.27 cm/s LVOT diam:     1.90 cm   LV E/e' lateral: 6.4 LV SV:         62 LV SV Index:   35 LVOT Area:     2.84 cm  RIGHT VENTRICLE RV S prime:     13.80 cm/s TAPSE (M-mode): 1.1 cm LEFT ATRIUM             Index        RIGHT ATRIUM           Index LA Vol (A2C):   48.4 ml 27.83 ml/m  RA Area:     11.00 cm LA Vol (A4C):   55.8 ml 32.09 ml/m  RA Volume:   22.60 ml  13.00 ml/m LA Biplane Vol: 56.2 ml 32.32 ml/m  AORTIC VALVE AV Area (Vmax):    2.70 cm AV Area (Vmean):   2.38 cm AV Area (VTI):     2.70 cm AV Vmax:  125.00 cm/s AV Vmean:          72.100 cm/s AV VTI:            0.228 m AV Peak Grad:      6.2 mmHg AV Mean Grad:      3.0 mmHg LVOT  Vmax:         119.00 cm/s LVOT Vmean:        60.500 cm/s LVOT VTI:          0.217 m LVOT/AV VTI ratio: 0.95  AORTA Ao Root diam: 3.40 cm Ao Asc diam:  3.50 cm MITRAL VALVE MV Area (PHT): 5.38 cm    SHUNTS MV Decel Time: 141 msec    Systemic VTI:  0.22 m MV E velocity: 52.70 cm/s  Systemic Diam: 1.90 cm MV A velocity: 40.30 cm/s MV E/A ratio:  1.31 Lyman Bishop MD Electronically signed by Lyman Bishop MD Signature Date/Time: 11/30/2022/2:38:42 PM    Final    CT ANGIO HEAD NECK W WO CM  Result Date: 11/30/2022 CLINICAL DATA:  Stroke/TIA, determine embolic source. EXAM: CT HEAD WITHOUT CONTRAST CT ANGIOGRAPHY OF THE HEAD AND NECK TECHNIQUE: Contiguous axial images were obtained from the base of the skull through the vertex without intravenous contrast. Multidetector CT imaging of the head and neck was performed using the standard protocol during bolus administration of intravenous contrast. Multiplanar CT image reconstructions and MIPs were obtained to evaluate the vascular anatomy. Carotid stenosis measurements (when applicable) are obtained utilizing NASCET criteria, using the distal internal carotid diameter as the denominator. RADIATION DOSE REDUCTION: This exam was performed according to the departmental dose-optimization program which includes automated exposure control, adjustment of the mA and/or kV according to patient size and/or use of iterative reconstruction technique. CONTRAST:  18mL OMNIPAQUE IOHEXOL 350 MG/ML SOLN COMPARISON:  MRI brain 11/29/2022. FINDINGS: CT HEAD Brain: No acute intracranial hemorrhage. Evolving infarct along the right precentral gyrus and premotor area. Beam hardening artifact along the left anterior temporal lobe. Chronic small-vessel disease. No hydrocephalus or extra-axial collection. No mass effect or midline shift. Vascular: No hyperdense vessel or unexpected calcification. Skull: Normal. Negative for fracture or focal lesion. Sinuses/Orbits: Mild mucosal disease in the  left maxillary sinus. CTA NECK Aortic arch: Three-vessel arch configuration. Arch vessel origins are patent. Right carotid system: The common and internal carotid arteries are patent to the skull base without stenosis, aneurysm or dissection. Left carotid system: The common and internal carotid arteries are patent to the skull base without stenosis, aneurysm or dissection. Vertebral arteries:Patent from the origin to the confluence with the basilar without stenosis or dissection. Skeleton: No suspicious bone lesions. Other neck: 11 mm predominantly hypoattenuating nodule in the left thyroid lobe with central calcification (No follow-up imaging is recommended). CTA HEAD Anterior circulation: Calcified plaque along the carotid siphons without hemodynamically significant stenosis. The proximal ACAs and MCAs are patent without stenosis or aneurysm. Distal branches are symmetric. Posterior circulation: Normal basilar artery. The SCAs, AICAs and PICAs are patent proximally. The PCAs are patent proximally without stenosis or aneurysm. Distal branches are symmetric. Venous sinuses: Patent. Anatomic variants: None. IMPRESSION: 1. Evolving infarct along the right precentral gyrus and premotor area. No acute intracranial hemorrhage. 2. No large vessel occlusion or hemodynamically significant stenosis in the head or neck. Electronically Signed   By: Emmit Alexanders M.D.   On: 11/30/2022 14:15    PHYSICAL EXAM  Temp:  [97.5 F (36.4 C)-99 F (37.2 C)] 98 F (36.7 C) (03/22 1307) Pulse  Rate:  [68-84] 70 (03/22 1300) Resp:  [14-20] 20 (03/22 1300) BP: (145-180)/(80-112) 156/90 (03/22 1307) SpO2:  [97 %-100 %] 97 % (03/22 1300) Weight:  [58.3 kg] 58.3 kg (03/21 1815)  General - Well nourished, well developed, in no apparent distress. Cardiovascular - Regular rhythm and rate.  Mental Status -  Level of arousal and orientation to time, place, and person were intact. Language including expression, naming,  repetition, comprehension was assessed and found intact. Attention span and concentration were normal. Recent and remote memory were intact. Fund of Knowledge was assessed and was intact.  Cranial Nerves II - XII - II - Visual field intact OU. III, IV, VI - Extraocular movements intact V - Facial sensation intact bilaterally. VII -left facial droop, old VIII - Hearing & vestibular intact bilaterally. X - Palate elevates symmetrically. XI - Chin turning & shoulder shrug intact bilaterally. XII - Tongue protrusion intact.  Motor Strength - The patient's strength was normal in all extremities and pronator drift was absent.  Bulk was normal and fasciculations were absent.   Motor Tone - Muscle tone was assessed at the neck and appendages and was normal. Sensory - Light touch, temperature/pinprick were assessed and were symmetrical.    Coordination - The patient had normal movements in the hands and feet with no ataxia or dysmetria.  Tremor was absent.  Gait and Station - deferred.  ASSESSMENT/PLAN Wanda Howell is a 77 y.o. female with unknown medical history who presented to [facility] for burning in her chest which was thought to be heartburn which worsened throughout the day she then noticed some left hand weakness.  In the emergency room she was noted to have an elevated troponin RI was done and was positive for stroke  Stroke: Acute right frontal infarct, etiology concerning for cardioembolic source CT head No acute abnormality. Small vessel disease. Atrophy. ASPECTS 10.    CTA head & neck evolving infarct in the right precentral gyrus and premotor area, no LVO MRI acute right frontal hemic infarct 2D Echo EF 65 to 70%.  Mild left ventricular hypertrophy LDL 105 HgbA1c 4.8 Will need follow-up with neurology outpatient in 4 to 8 weeks Recommend 30-day monitoring/loop recorder- pt like to discussed with her cardiologist further at outpatient follow-up VTE prophylaxis -IV  heparin No antithrombotics prior to admission, now on aspirin 81 mg.  She is also on IV heparin currently, may start Plavix when off heparin IV, and continue DAPT for 3 weeks and then ASA alone. Therapy recommendations: Home health PT Disposition: Pending  Hypertension Home meds: None stable Long-term BP goal normotensive  NSTEMI  Elevated troponin Echo normal EF  On aspirin 81 On IV heparin for 48 hours per cardiology  Recommend 30-day CardioNet monitoring or loop recorder to rule out A-fib.  Patient like to discuss further with her cardiology at outpatient follow-up  hyperlipidemia Home meds: None, LDL 105, goal < 70 Add atorvastatin 40 Continue statin at discharge  Other Stroke Risk Factors Advanced Age >/= 13  Hx stroke/TIA  Other Active Problems Anemia AKI, creatinine 1.16-1.01 Leukocytosis WBC 16.0-7.2  Hospital day # 1  Beulah Gandy DNP, ACNPC-AG  [facility]  ATTENDING NOTE: I reviewed above note and agree with the assessment and plan. Pt was seen and examined.   No family at the bedside.  Patient lying in bed, neuro intact, no focal deficits.  Patient is tired and do not want discuss further about long-term cardiac monitoring but agree with follow-up with  her cardiology as outpatient to discuss further.  Currently on aspirin 81 and heparin IV.  Once heparin IV off tomorrow, recommend DAPT with aspirin 81 and Plavix 75 for 3 weeks and then aspirin alone.  Continue statin.  PT therapy recommend home health.  For detailed assessment and plan, please refer to above/below as I have made changes wherever appropriate.   Neurology will sign off. Please call with questions. Pt will follow up with stroke clinic NP at [facility] in about 4 weeks. Thanks for the consult.   Rosalin Hawking, MD PhD Stroke Neurology 11/30/2022 9:55 PM    To contact Stroke Continuity provider, please refer to [email or web address] After hours, contact General Neurology

## 2022-11-30 NOTE — TOC Initial Note (Signed)
Transition of Care [facility]) - Initial/Assessment Note    Patient Details  Name: Wanda Howell MRN: [id number] Date of Birth: 09-Nov-1945  Transition of Care [facility]) CM/SW Contact:    Marilu Favre, RN Phone Number: 11/30/2022, 1:33 PM  Clinical Narrative:                 Spoke to patient at bedside. Confirmed face sheet information.   Patient from home alone but has friends who can assist.   Ordered walker with [place].  Patient does not have PCP agreeable to follow up with Internal Medicine Teaching Service at [facility] Dr Stann Mainland will schedule an appointment ( clinic office closed today) and place on AVS.   Home health PT OT arranged with Hoyle Sauer with [facility].   Expected Discharge Plan: [place] Barriers to Discharge: Continued Medical Work up   Patient Goals and CMS Choice Patient states their goals for this hospitalization and ongoing recovery are:: to return to home CMS Medicare.gov Compare Post Acute Care list provided to:: Patient Choice offered to / list presented to : Patient Regino Ramirez ownership interest in [facility].provided to:: Patient    Expected Discharge Plan and Services   Discharge Planning Services: CM Consult Post Acute Care Choice: Home Health, Durable Medical Equipment Living arrangements for the past 2 months: Apartment                 DME Arranged: Walker rolling DME Agency: [employer] Date DME Agency Contacted: 11/30/22 Time DME Agency Contacted: [id number] Representative spoke with at DME Agency: Erasmo Downer HH Arranged: PT, OT [place] Agency: Other - See comment ([place]) Date [place]: 11/30/22 Time [place]: 88 Representative spoke with at [place]: [place] Arrangements/Services Living arrangements for the past 2 months: Apartment Lives with:: Self Patient language and need for interpreter reviewed:: Yes Do you feel safe going back to the place where you  live?: Yes      Need for Family Participation in Patient Care: Yes (Comment) Care giver support system in place?: Yes (comment)   Criminal Activity/Legal Involvement Pertinent to Current Situation/Hospitalization: No - Comment as needed  Activities of Daily Living Home Assistive Devices/Equipment: None ADL Screening (condition at time of admission) Patient's cognitive ability adequate to safely complete daily activities?: Yes Is the patient deaf or have difficulty hearing?: No Does the patient have difficulty seeing, even when wearing glasses/contacts?: No Does the patient have difficulty concentrating, remembering, or making decisions?: No Patient able to express need for assistance with ADLs?: No Does the patient have difficulty dressing or bathing?: No Independently performs ADLs?: Yes (appropriate for developmental age) Does the patient have difficulty walking or climbing stairs?: No Weakness of Legs: None Weakness of Arms/Hands: None  Permission Sought/Granted   Permission granted to share information with : No              Emotional Assessment Appearance:: Appears stated age Attitude/Demeanor/Rapport: Engaged Affect (typically observed): Accepting Orientation: : Oriented to Self, Oriented to Place, Oriented to  Time, Oriented to Situation Alcohol / Substance Use: Not Applicable Psych Involvement: No (comment)  Admission diagnosis:  Slurred speech [R47.81] NSTEMI (non-ST elevated myocardial infarction) ([place]) [I21.4] Fall, initial encounter [W19.XXXA] Laceration of scalp, initial encounter [S01.01XA] Acute CVA (cerebrovascular accident) [facility]) [I63.9] Patient Active Problem List   Diagnosis Date Noted   Fall 11/30/2022   Scalp laceration 11/30/2022   Acute CVA (cerebrovascular accident) ([place]) 11/29/2022  NSTEMI (non-ST elevated myocardial infarction) ([place]) 11/29/2022   Fracture of ramus of left pubis ([place]) 11/29/2022   PCP:  Patient, No Pcp Per Pharmacy:   [place], [place] - [address] [place] [address] Phone: [phone] Fax: [phone]     Social Determinants of Health (SDOH) Social History: [place]: No Food Insecurity (11/29/2022)  Housing: Low Risk  (11/29/2022)  Transportation Needs: No Transportation Needs (11/29/2022)  Utilities: Not At Risk (11/29/2022)  Tobacco Use: Unknown (11/29/2022)   SDOH Interventions:     Readmission Risk Interventions     No data to display

## 2022-11-30 NOTE — Progress Notes (Signed)
OT Cancellation Note  Patient Details Name: Wanda Howell MRN: [id number] DOB: 1946/06/17   Cancelled Treatment:    Reason Eval/Treat Not Completed: Patient not medically ready: RN advised for OT to wait until ECHO completed this morning.   Julien Girt 11/30/2022, 2:12 PM

## 2022-11-30 NOTE — Progress Notes (Signed)
Patient taken down for CT

## 2022-11-30 NOTE — TOC CM/SW Note (Signed)
Went to see patient. Patient off unit. PT recommendation for HHPT and walker. Per chart patient does not have a PCP . Internal Medicine Clinic closed today. NCM secure chatted team asking if they can schedule a follow up appointment ( needed for [facility] services). Dr Stann Mainland responded they will schedule appointment.   TOC team will follow up with patient regarding [place] and walker

## 2022-11-30 NOTE — Progress Notes (Signed)
ANTICOAGULATION CONSULT NOTE   Pharmacy Consult for heparin Indication: chest pain/ACS and stroke  No Known Allergies  Patient Measurements: Height: 5' 10.5" (179.1 cm) Weight: 58.3 kg (128 lb 8.5 oz) IBW/kg (Calculated) : 69.65 Heparin Dosing Weight: 68 kg   Vital Signs: Temp: 98.1 F (36.7 C) (03/22 0700) Temp Source: Oral (03/22 0700) BP: 156/93 (03/22 0829) Pulse Rate: 68 (03/22 0829)  Labs: Recent Labs    11/29/22 1112 11/29/22 1243 11/29/22 2224 11/30/22 0741  HGB 12.6  --   --  11.4*  HCT 38.1  --   --  34.3*  PLT 215  --   --  195  HEPARINUNFRC  --   --  0.29* 0.39  CREATININE 1.16*  --   --  1.01*  TROPONINIHS 2,040* 1,896*  --   --    Estimated Creatinine Clearance: 43.6 mL/min (A) (by C-G formula based on SCr of 1.01 mg/dL (H)).   Assessment: KE is a 77 year old F admitted after a fall found to have elevated troponin at 2040. Patient reports no medications prior to admission. Patient found to have approximately 3 cm occipital scalp laceration with minimal bleeding. Will round heparin doses down due to bleed.  Heparin previously initiated utilizing ACS protocol. MRI revealing Acute right frontal cortical and subcortical infarct.   Heparin level therapeutic 0.39, no issues with heparin infusion or bleeding reported. CBC stable, platelets 195.  Goal of Therapy:  Heparin level 0.3-0.5 units/ml Monitor platelets by anticoagulation protocol: Yes   Plan:  Continue heparin infusion at 800 units/hr Check heparin level in 8 hours and daily while on heparin Continue to monitor H&H and platelets  Thank you for allowing pharmacy to be a part of this patient's care.  Ardyth Harps, PharmD Clinical Pharmacist

## 2022-12-01 ENCOUNTER — Inpatient Hospital Stay (HOSPITAL_COMMUNITY): Payer: Medicare Other

## 2022-12-01 DIAGNOSIS — E559 Vitamin D deficiency, unspecified: Secondary | ICD-10-CM

## 2022-12-01 HISTORY — DX: Vitamin D deficiency, unspecified: E55.9

## 2022-12-01 LAB — CBC
HCT: 30.4 % — ABNORMAL LOW (ref 36.0–46.0)
Hemoglobin: 10.2 g/dL — ABNORMAL LOW (ref 12.0–15.0)
MCH: 37 pg — ABNORMAL HIGH (ref 26.0–34.0)
MCHC: 33.6 g/dL (ref 30.0–36.0)
MCV: 110.1 fL — ABNORMAL HIGH (ref 80.0–100.0)
Platelets: 163 10*3/uL (ref 150–400)
RBC: 2.76 MIL/uL — ABNORMAL LOW (ref 3.87–5.11)
RDW: 14.4 % (ref 11.5–15.5)
WBC: 6.6 10*3/uL (ref 4.0–10.5)
nRBC: 0 % (ref 0.0–0.2)

## 2022-12-01 LAB — BASIC METABOLIC PANEL
Anion gap: 7 (ref 5–15)
BUN: 22 mg/dL (ref 8–23)
CO2: 20 mmol/L — ABNORMAL LOW (ref 22–32)
Calcium: 8.1 mg/dL — ABNORMAL LOW (ref 8.9–10.3)
Chloride: 113 mmol/L — ABNORMAL HIGH (ref 98–111)
Creatinine, Ser: 1.06 mg/dL — ABNORMAL HIGH (ref 0.44–1.00)
GFR, Estimated: 54 mL/min — ABNORMAL LOW (ref >60)
Glucose, Bld: 113 mg/dL — ABNORMAL HIGH (ref 70–99)
Potassium: 3.5 mmol/L (ref 3.5–5.1)
Sodium: 140 mmol/L (ref 135–145)

## 2022-12-01 LAB — VITAMIN D 25 HYDROXY (VIT D DEFICIENCY, FRACTURES): Vit D, 25-Hydroxy: 7.92 ng/mL — ABNORMAL LOW (ref 30–100)

## 2022-12-01 LAB — HEPARIN LEVEL (UNFRACTIONATED): Heparin Unfractionated: 0.36 IU/mL (ref 0.30–0.70)

## 2022-12-01 MED ORDER — LISINOPRIL 10 MG PO TABS
10.0000 mg | ORAL_TABLET | Freq: Every day | ORAL | Status: DC
  Administered 2022-12-01: 10 mg via ORAL
  Filled 2022-12-01: qty 1

## 2022-12-01 MED ORDER — CLOPIDOGREL BISULFATE 75 MG PO TABS: 75.0000 mg | ORAL_TABLET | Freq: Every day | ORAL | 2 refills | Status: AC

## 2022-12-01 MED ORDER — LISINOPRIL 10 MG PO TABS: 10.0000 mg | ORAL_TABLET | Freq: Every day | ORAL | 2 refills | Status: AC

## 2022-12-01 MED ORDER — VITAMIN D (ERGOCALCIFEROL) 1.25 MG (50000 UNIT) PO CAPS: 50000.0000 [IU] | ORAL_CAPSULE | ORAL | 0 refills | Status: AC

## 2022-12-01 MED ORDER — ASPIRIN 81 MG PO TBEC: 81.0000 mg | DELAYED_RELEASE_TABLET | Freq: Every day | ORAL | 2 refills | Status: AC

## 2022-12-01 MED ORDER — ATORVASTATIN CALCIUM 40 MG PO TABS: 40.0000 mg | ORAL_TABLET | Freq: Every day | ORAL | 2 refills | Status: AC

## 2022-12-01 NOTE — Progress Notes (Signed)
OT Cancellation Note  Patient Details Name: ALAYNNA LOUGHNANE MRN: [id number] DOB: [birth year]   Cancelled Treatment:    Reason Eval/Treat Not Completed: Patient declined, no reason specified  OT introducing self to patient, and the reason for her evaluation. Patient initially receptive to education and stating, "I am definitely going home today regardless." Patient declining any OOB activities or ADLs to be able to fully assess current level, and not answering neuro exam questions to determine current cognitive status. Patient also stating, "I'm not going to need any help at home, and no one is going to check on me" when OT asking if patient had a support system in case of additional need to complete ADLs or IADLs. OT asking if she could educate the patient on the signs and symptoms of a stroke, which patient stating, "There have been too many of you in here, I am overwhelmed, I would be receptive to someone coming out to the house and educating me once I am there." Message relayed to case management and RN, with OT signing off at this time. Please re-consult if patient would be receptive to acute care services.    Corinne Ports E. Shmiel Morton, OTR/L Acute Rehabilitation Services [phone]   Ascencion Dike 12/01/2022, 10:37 AM

## 2022-12-01 NOTE — Discharge Summary (Addendum)
Name: BURMA PURTEE MRN: [id number] DOB: April 17, 1946 77 y.o. PCP: Patient, No Pcp Per  Date of Admission: 11/29/2022 10:19 AM Date of Discharge: 12/01/2022 Attending Physician: Sid Falcon, MD  Discharge Diagnosis: 1. Principal Problem:   Acute CVA (cerebrovascular accident) [facility]) Active Problems:   NSTEMI (non-ST elevated myocardial infarction) ([place])   Fracture of ramus of left pubis (HCC)   Fall   Scalp laceration   Elevated troponin   Vitamin D deficiency   Discharge Medications: Allergies as of 12/01/2022   No Known Allergies      Medication List     STOP taking these medications    HYDROcodone-acetaminophen 5-325 MG tablet Commonly known as: NORCO/VICODIN       TAKE these medications    Aspirin Low Dose 81 MG tablet Generic drug: aspirin EC Take 1 tablet (81 mg total) by mouth daily. Swallow whole.   atorvastatin 40 MG tablet Commonly known as: LIPITOR Take 1 tablet (40 mg total) by mouth daily.   clopidogrel 75 MG tablet Commonly known as: Plavix Take 1 tablet (75 mg total) by mouth daily.   lisinopril 10 MG tablet Commonly known as: ZESTRIL Take 1 tablet (10 mg total) by mouth daily.   Vitamin D (Ergocalciferol) 1.25 MG (50000 UNIT) Caps capsule Commonly known as: DRISDOL Take 1 capsule (50,000 Units total) by mouth every 7 (seven) days for 12 doses.               Durable Medical Equipment  (From admission, onward)           Start     Ordered   11/30/22 1324  For home use only DME Walker rolling  Once       Question Answer Comment  Walker: With 5 Inch Wheels   Patient needs a walker to treat with the following condition Weakness      11/30/22 1323            Disposition and follow-up:   Ms.Arisbeth G Rabel was discharged from [facility] in Stable condition.  At the hospital follow up visit please address:  1.  Acute CVA -DAPT (Plavix and ASA) for 3 weeks then aspirin alone -Lipitor 40 mg, goal LDL  < 70 -Lisinopril 10 mg, repeat BMP in 2-3 weeks -Follow-up with neurology in 4 weeks -Patient declined cardiac monitor and DVT doppler this admission.  Please revisit this topic with her at follow-up  NSTEMI -Follow-up with cardiology -Antiplatelet with aspirin and Plavix  Left pubic ramus fracture Osteoporosis Vitamin D deficiency -Vitamin D level 7.9.  Start high-dose vitamin  50000 U weekly x 12 weeks -Discuss bisphosphonate therapy at follow up  2.  Labs / imaging needed at time of follow-up: CBC, BMP  3.  Pending labs/ test needing follow-up: vitamin D  Follow-up Appointments:  Follow-up Information     Lottie Mussel, MD Follow up.   Specialty: Internal Medicine Why: Clinic will call you with follow up appointment Contact information: [address] [place] [address] [place] Follow up.   Contact information: [address] [place] [address] [phone]         Buford Dresser, MD Follow up.   Specialty: Cardiology Why: Hospital follow-up with Cardiology scheduled for 02/06/2023 at 8:20am. Please arrive 15 minutes early for check. If this date/times does not work for you, please call our office to reschedule. Contact information:  [address] [place] [address] [phone]         [facility]. Schedule an appointment as soon as possible for a visit in 1 month(s).   Specialty: Neurology Why: stroke clinic Contact information: [facility] Course by problem list: Acute CVA 77 year old female with unknown past medical history and does not follow-up with a PCP, who presents after a mechanical fall fall at home left hand weakness and had a posterior head laceration.  In the emergency room she was noticed to have dysarthria.  Her MRI was obtained and  showed to have acute right frontal cortical and subcortical infarcts, consistent with her neurological deficit. Neurology team was consulted. CTA head and neck was obtained and did not show any large vessel occlusion.  Echocardiogram was unremarkable. Patient was originally started on IV heparin for NSTEMI and was transitioned to aspirin and Plavix after discharge.  She will continue DAPT for 3 weeks then aspirin alone  Lipitor and antihypertensive medication were started for her.  Patient was discharged with home health PT/OT.  She will follow-up with neurology in 4 weeks.  NSTEMI Initial troponin was elevated and peaked at 2000s.  EKG showed ST depression of the inferior leads without prior EKG to compare.  Patient did have 1 episode of chest pain 1 day prior to the admission which located central chest without radiation. Cardiology was consulted and recommended IV heparin. Echocardiogram was performed and showed normal EF without any wall abnormality.  Because of unremarkable echo and patient's refusal of heart cath, she was discharged with antiplatelets and Lipitor.  She will follow-up with cardiology outpatient.  Left inferior pubic ramus fracture Osteoporosis Pelvic x-ray showed acute minimally displaced fracture of left infer pubic ramus after her fall.  PT recommended home health PT.  Patient was diagnosed with osteoporosis due to this fragility fracture.  She was started on vitamin D supplementation and we will discuss bisphosphonate therapy outpatient.  HPI Patient reports that she is feeling better and that she is doing better.  Denies any new neurological deficits.  Patient notes that she is okay with following with the [facility]. She notes that she is ready to go home. She states that she wants to go back to living her normal life.   Discharge Exam:   BP (!) 169/90 (BP Location: Right Arm)   Pulse 69   Temp 98.5 F (36.9 C) (Oral)   Resp 19   Ht 5' 10.5" (1.791 m)   Wt 58.3 kg   SpO2 98%    BMI 18.18 kg/m  Discharge exam:   Physical Exam Constitutional:      General: She is not in acute distress.    Appearance: She is not ill-appearing.  HENT:     Head: Normocephalic.  Eyes:     General:        Right eye: No discharge.        Left eye: No discharge.     Conjunctiva/sclera: Conjunctivae normal.  Cardiovascular:     Rate and Rhythm: Normal rate and regular rhythm.  Pulmonary:     Effort: Pulmonary effort is normal. No respiratory distress.  Musculoskeletal:        General: Normal range of motion.  Skin:    General: Skin is warm.  Neurological:     Mental Status: She is alert and oriented  to person, place, and time.     Comments: Left facial droop with dysarthria 5/5 strength bilateral upper extremities 5/5 Strength bilateral lower extremities.  Slightly weaker on the left hip due to pelvic fracture  Psychiatric:        Mood and Affect: Mood normal.      Pertinent Labs, Studies, and Procedures:     Latest Ref Rng & Units 12/01/2022    1:57 AM 11/30/2022    7:41 AM 11/29/2022   11:12 AM  CBC  WBC 4.0 - 10.5 K/uL 6.6  7.2  16.0   Hemoglobin 12.0 - 15.0 g/dL 10.2  11.4  12.6   Hematocrit 36.0 - 46.0 % 30.4  34.3  38.1   Platelets 150 - 400 K/uL 163  195  215       Latest Ref Rng & Units 12/01/2022    1:54 AM 11/30/2022    7:41 AM 11/29/2022   11:12 AM  BMP  Glucose 70 - 99 mg/dL 113  92  96   BUN 8 - 23 mg/dL 22  23  19    Creatinine 0.44 - 1.00 mg/dL 1.06  1.01  1.16   Sodium 135 - 145 mmol/L 140  140  139   Potassium 3.5 - 5.1 mmol/L 3.5  3.6  3.9   Chloride 98 - 111 mmol/L 113  112  108   CO2 22 - 32 mmol/L 20  19  19    Calcium 8.9 - 10.3 mg/dL 8.1  8.5  9.2     ECHOCARDIOGRAM COMPLETE  Result Date: 11/30/2022    ECHOCARDIOGRAM REPORT   Patient Name:   INDA NEWBROUGH [facility] Date of Exam: 11/30/2022 Medical Rec #:  [id number]      Height:       70.5 in Accession #:    [id number]     Weight:       128.5 lb Date of Birth:  Sep 21, 1945       BSA:          1.739 m  Patient Age:    29 years       BP:           156/93 mmHg Patient Gender: F              HR:           70 bpm. Exam Location:  Inpatient Procedure: Intracardiac Opacification Agent, Cardiac Doppler, Color Doppler and            2D Echo Indications:   Elevated Troponin  History:       Patient has no prior history of Echocardiogram examinations.                NSTEMI.  Sonographer:   Marella Chimes Referring      [id number] Lily Kocher Phys:  Sonographer Comments: Technically difficult study due to poor echo windows. IMPRESSIONS  1. Left ventricular ejection fraction, by estimation, is 65 to 70%. The left ventricle has normal function. The left ventricle demonstrates grossly normal wall motion with contrast enhancement.  2. There is mild left ventricular hypertrophy. Left ventricular diastolic parameters were normal.  3. Right ventricular systolic function is normal. The right ventricular size is normal.  4. The mitral valve is abnormal. Trivial mitral valve regurgitation.  5. The aortic valve was not well visualized. Aortic valve regurgitation is not visualized. Aortic valve sclerosis is present, with no evidence of aortic valve stenosis.  6. The inferior vena cava is normal in  size with greater than 50% respiratory variability, suggesting right atrial pressure of 3 mmHg. Comparison(s): No prior Echocardiogram. FINDINGS  Left Ventricle: Left ventricular ejection fraction, by estimation, is 65 to 70%. The left ventricle has normal function. The left ventricle has no regional wall motion abnormalities. Definity contrast agent was given IV to delineate the left ventricular  endocardial borders. The left ventricular internal cavity size was normal in size. There is mild left ventricular hypertrophy. Left ventricular diastolic parameters were normal. Right Ventricle: The right ventricular size is normal. No increase in right ventricular wall thickness. Right ventricular systolic function is normal. Left Atrium: Left atrial  size was normal in size. Right Atrium: Right atrial size was normal in size. Pericardium: There is no evidence of pericardial effusion. Mitral Valve: The mitral valve is abnormal. Mild mitral annular calcification. Trivial mitral valve regurgitation. Tricuspid Valve: The tricuspid valve is grossly normal. Tricuspid valve regurgitation is trivial. Aortic Valve: The aortic valve was not well visualized. Aortic valve regurgitation is not visualized. Aortic valve sclerosis is present, with no evidence of aortic valve stenosis. Aortic valve mean gradient measures 3.0 mmHg. Aortic valve peak gradient measures 6.2 mmHg. Aortic valve area, by VTI measures 2.70 cm. Pulmonic Valve: The pulmonic valve was normal in structure. Pulmonic valve regurgitation is not visualized. Aorta: The aortic root and ascending aorta are structurally normal, with no evidence of dilitation. Venous: The inferior vena cava is normal in size with greater than 50% respiratory variability, suggesting right atrial pressure of 3 mmHg. IAS/Shunts: No atrial level shunt detected by color flow Doppler.  LEFT VENTRICLE PLAX 2D LVIDd:         3.90 cm   Diastology LVIDs:         2.60 cm   LV e' medial:    7.18 cm/s LV PW:         1.20 cm   LV E/e' medial:  7.3 LV IVS:        1.10 cm   LV e' lateral:   8.27 cm/s LVOT diam:     1.90 cm   LV E/e' lateral: 6.4 LV SV:         62 LV SV Index:   35 LVOT Area:     2.84 cm  RIGHT VENTRICLE RV S prime:     13.80 cm/s TAPSE (M-mode): 1.1 cm LEFT ATRIUM             Index        RIGHT ATRIUM           Index LA Vol (A2C):   48.4 ml 27.83 ml/m  RA Area:     11.00 cm LA Vol (A4C):   55.8 ml 32.09 ml/m  RA Volume:   22.60 ml  13.00 ml/m LA Biplane Vol: 56.2 ml 32.32 ml/m  AORTIC VALVE AV Area (Vmax):    2.70 cm AV Area (Vmean):   2.38 cm AV Area (VTI):     2.70 cm AV Vmax:           125.00 cm/s AV Vmean:          72.100 cm/s AV VTI:            0.228 m AV Peak Grad:      6.2 mmHg AV Mean Grad:      3.0 mmHg LVOT  Vmax:         119.00 cm/s LVOT Vmean:        60.500 cm/s LVOT VTI:  0.217 m LVOT/AV VTI ratio: 0.95  AORTA Ao Root diam: 3.40 cm Ao Asc diam:  3.50 cm MITRAL VALVE MV Area (PHT): 5.38 cm    SHUNTS MV Decel Time: 141 msec    Systemic VTI:  0.22 m MV E velocity: 52.70 cm/s  Systemic Diam: 1.90 cm MV A velocity: 40.30 cm/s MV E/A ratio:  1.31 Lyman Bishop MD Electronically signed by Lyman Bishop MD Signature Date/Time: 11/30/2022/2:38:42 PM    Final    CT ANGIO HEAD NECK W WO CM  Result Date: 11/30/2022 CLINICAL DATA:  Stroke/TIA, determine embolic source. EXAM: CT HEAD WITHOUT CONTRAST CT ANGIOGRAPHY OF THE HEAD AND NECK TECHNIQUE: Contiguous axial images were obtained from the base of the skull through the vertex without intravenous contrast. Multidetector CT imaging of the head and neck was performed using the standard protocol during bolus administration of intravenous contrast. Multiplanar CT image reconstructions and MIPs were obtained to evaluate the vascular anatomy. Carotid stenosis measurements (when applicable) are obtained utilizing NASCET criteria, using the distal internal carotid diameter as the denominator. RADIATION DOSE REDUCTION: This exam was performed according to the departmental dose-optimization program which includes automated exposure control, adjustment of the mA and/or kV according to patient size and/or use of iterative reconstruction technique. CONTRAST:  32mL OMNIPAQUE IOHEXOL 350 MG/ML SOLN COMPARISON:  MRI brain 11/29/2022. FINDINGS: CT HEAD Brain: No acute intracranial hemorrhage. Evolving infarct along the right precentral gyrus and premotor area. Beam hardening artifact along the left anterior temporal lobe. Chronic small-vessel disease. No hydrocephalus or extra-axial collection. No mass effect or midline shift. Vascular: No hyperdense vessel or unexpected calcification. Skull: Normal. Negative for fracture or focal lesion. Sinuses/Orbits: Mild mucosal disease in the  left maxillary sinus. CTA NECK Aortic arch: Three-vessel arch configuration. Arch vessel origins are patent. Right carotid system: The common and internal carotid arteries are patent to the skull base without stenosis, aneurysm or dissection. Left carotid system: The common and internal carotid arteries are patent to the skull base without stenosis, aneurysm or dissection. Vertebral arteries:Patent from the origin to the confluence with the basilar without stenosis or dissection. Skeleton: No suspicious bone lesions. Other neck: 11 mm predominantly hypoattenuating nodule in the left thyroid lobe with central calcification (No follow-up imaging is recommended). CTA HEAD Anterior circulation: Calcified plaque along the carotid siphons without hemodynamically significant stenosis. The proximal ACAs and MCAs are patent without stenosis or aneurysm. Distal branches are symmetric. Posterior circulation: Normal basilar artery. The SCAs, AICAs and PICAs are patent proximally. The PCAs are patent proximally without stenosis or aneurysm. Distal branches are symmetric. Venous sinuses: Patent. Anatomic variants: None. IMPRESSION: 1. Evolving infarct along the right precentral gyrus and premotor area. No acute intracranial hemorrhage. 2. No large vessel occlusion or hemodynamically significant stenosis in the head or neck. Electronically Signed   By: Emmit Alexanders M.D.   On: 11/30/2022 14:15   MR BRAIN WO CONTRAST  Result Date: 11/29/2022 CLINICAL DATA:  Transient ischemic attack (TIA) EXAM: MRI HEAD WITHOUT CONTRAST TECHNIQUE: Multiplanar, multiecho pulse sequences of the brain and surrounding structures were obtained without intravenous contrast. COMPARISON:  CT head from the same day. 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 Mild associated edema without mass effect. No evidence of acute hemorrhage, mass lesion, midline shift or hydrocephalus. Remote left cerebellar infarct. Vascular: Major  arterial flow voids are maintained at the skull base. Skull and upper cervical spine: Normal marrow signal. Sinuses/Orbits: Left maxillary sinus retention cysts. Otherwise, clear sinuses. No acute  orbital findings. Other: No mastoid effusions. IMPRESSION: Acute right frontal cortical and subcortical infarct. Electronically Signed   By: Margaretha Sheffield M.D.   On: 11/29/2022 15:22   CT Cervical Spine Wo Contrast  Result Date: 11/29/2022 CLINICAL DATA:  Trauma EXAM: CT CERVICAL SPINE WITHOUT CONTRAST TECHNIQUE: Multidetector CT imaging of the cervical spine was performed without intravenous contrast. Multiplanar CT image reconstructions were also generated. RADIATION DOSE REDUCTION: This exam was performed according to the departmental dose-optimization program which includes automated exposure control, adjustment of the mA and/or kV according to patient size and/or use of iterative reconstruction technique. COMPARISON:  None Available. FINDINGS: Alignment: Normal. Skull base and vertebrae: No acute fracture. No primary bone lesion or focal pathologic process. Soft tissues and spinal canal: No prevertebral fluid or swelling. No visible canal hematoma. Disc levels: Disc space narrowing with marginal osteophyte formation identified C5-6 and C6-7. Osteoarthritis identified at C1-C2. Upper chest: Negative. Other: None. IMPRESSION: Degenerative changes.  No acute traumatic abnormalities. Electronically Signed   By: Sammie Bench M.D.   On: 11/29/2022 12:33   CT Head Wo Contrast  Result Date: 11/29/2022 CLINICAL DATA:  Head trauma, moderate-severe EXAM: CT HEAD WITHOUT CONTRAST TECHNIQUE: Contiguous axial images were obtained from the base of the skull through the vertex without intravenous contrast. RADIATION DOSE REDUCTION: This exam was performed according to the departmental dose-optimization program which includes automated exposure control, adjustment of the mA and/or kV according to patient size and/or  use of iterative reconstruction technique. COMPARISON:  None Available. FINDINGS: Brain: There is periventricular white matter decreased attenuation consistent with small vessel ischemic changes. Ventricles, sulci and cisterns are prominent consistent with age related involutional changes. No acute intracranial hemorrhage, mass effect or shift. No hydrocephalus. Vascular: No hyperdense vessel or unexpected calcification. Skull: Normal. Negative for fracture or focal lesion. Sinuses/Orbits: No acute finding. IMPRESSION: Atrophy and chronic small vessel ischemic changes. No acute intracranial process identified. Electronically Signed   By: Sammie Bench M.D.   On: 11/29/2022 12:29   DG Pelvis Portable  Result Date: 11/29/2022 CLINICAL DATA:  Fall. EXAM: PORTABLE PELVIS 1-2 VIEWS COMPARISON:  None Available. FINDINGS: There is diffuse decreased bone mineralization. There are markedly severe degenerative changes of the superior left femoroacetabular joint with chronic moderate superior left femoral head and adjacent acetabular cortical erosion and bone remodeling with diffuse subchondral sclerosis and cystic change. 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 Mild to moderate superomedial right femoroacetabular joint space narrowing. There is oblique linear lucency within the left inferior pubic ramus suggesting an acute fracture with up to approximately 3 mm diastasis otherwise no significant displacement. No definitive additional pelvic fracture is visualized, however usually there is more than one pelvic ring fracture, and it is possible a second left superior pubic ramus or left acetabular fracture is not visualized on this limited single frontal view. IMPRESSION: 1. Acute minimally displaced fracture of the left inferior pubic ramus. 2. No definitive additional pelvic fracture is visualized, however usually there is more than one pelvic ring fracture, and it is possible a second  left superior pubic ramus or left acetabular fracture is not visualized on this limited frontal view. 3. 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 Electronically Signed   By: Yvonne Kendall M.D.   On: 11/29/2022 11:55   DG Chest Portable 1 View  Result Date: 11/29/2022 CLINICAL DATA:  Fall. EXAM: PORTABLE CHEST 1 VIEW COMPARISON:  None Available. FINDINGS:  Cardiac silhouette and mediastinal contours are within normal limits. Moderate calcification within the aortic arch. The patient is mildly rotated to the left. Within this limitation, no focal airspace opacity is seen. No pleural effusion pneumothorax. No definite acute displaced rib fracture is visualized. IMPRESSION: 1. No acute cardiopulmonary process. 2. No definite acute displaced rib fracture. Electronically Signed   By: Yvonne Kendall M.D.   On: 11/29/2022 11:36     Discharge Instructions: Discharge Instructions     Ambulatory referral to Neurology   Complete by: As directed    Follow up with stroke clinic NP (Jessica Vanschaick or Cecille Rubin, if both not available, consider Zachery Dauer, or [place]) at [facility] in about 4 weeks. Thanks.   Call MD for:  difficulty breathing, headache or visual disturbances   Complete by: As directed    Call MD for:  extreme fatigue   Complete by: As directed    Call MD for:  hives   Complete by: As directed    Call MD for:  persistant dizziness or light-headedness   Complete by: As directed    Call MD for:  persistant nausea and vomiting   Complete by: As directed    Call MD for:  redness, tenderness, or signs of infection (pain, swelling, redness, odor or green/yellow discharge around incision site)   Complete by: As directed    Call MD for:  severe uncontrolled pain   Complete by: As directed    Call MD for:  temperature >100.4   Complete by: As directed    Diet - low sodium heart healthy   Complete by: As directed     Discharge instructions   Complete by: As directed    Ms. Soy,  It was a pleasure taking care of you during this admission.  You were admitted for a stroke and also a possible heart attack.   For your stroke, please start taking aspirin and Plavix for 3 weeks then aspirin alone.  You also need to take Lipitor 40 mg and lisinopril 10 mg daily for your cholesterol and blood pressure.  Please follow-up with your neurologist in 4 weeks.  For your heart issue, fortunately your heart did not show any signs of dysfunction.  The cardiologist will schedule an follow-up appointment for you.  Your vitamin D level is very low, please start taking vitamin D 50000 U weekly for 12 weeks.  Please follow-up with the internal medicine clinic at [facility] in 2-3 weeks.  The front desk will call you to arrange an appointment.  Take care,  Dr. Alfonse Spruce   Increase activity slowly   Complete by: As directed    No wound care   Complete by: As directed        Signed: Gaylan Gerold, DO 12/01/2022, 10:11 AM   Pager: [phone]

## 2022-12-01 NOTE — Plan of Care (Signed)

## 2022-12-01 NOTE — Progress Notes (Addendum)
PT Cancellation Note  Patient Details Name: Wanda Howell MRN: [id number] DOB: 10-20-1945   Cancelled Treatment:    Reason Eval/Treat Not Completed: Patient declined, no reason specified. Per OT upon recent attempt to work with pt, pt was not receptive to education and declining all mobility at this time. Will plan to follow-up later as time permits.   Addendum 13:33 - Attempted PT session, but pt reporting she does not have any concerns in regards to her functional mobility upon planned d/c home today. Pt stating "nothing against you, but I don't want to do anything. I just want to get out of here". Per pt's wishes, will hold on PT at this time. Will plan to follow-up another day if pt ends up staying in the hospital.  Moishe Spice, PT, DPT Acute Rehabilitation Services  Office: [place] 12/01/2022, 10:53 AM

## 2022-12-01 NOTE — Progress Notes (Signed)
ANTICOAGULATION CONSULT NOTE   Pharmacy Consult for heparin Indication: chest pain/ACS and stroke  No Known Allergies  Patient Measurements: Height: 5' 10.5" (179.1 cm) Weight: 58.3 kg (128 lb 8.5 oz) IBW/kg (Calculated) : 69.65 Heparin Dosing Weight: 68 kg   Vital Signs: Temp: 98.5 F (36.9 C) (03/23 0900) Temp Source: Oral (03/23 0900) BP: 169/90 (03/23 0900) Pulse Rate: 69 (03/23 0900)  Labs: Recent Labs    11/29/22 1112 11/29/22 1243 11/29/22 2224 11/30/22 0741 11/30/22 1558 12/01/22 0154 12/01/22 0157 12/01/22 0931  HGB 12.6  --   --  11.4*  --   --  10.2*  --   HCT 38.1  --   --  34.3*  --   --  30.4*  --   PLT 215  --   --  195  --   --  163  --   HEPARINUNFRC  --   --    < > 0.39 0.46  --   --  0.36  CREATININE 1.16*  --   --  1.01*  --  1.06*  --   --   TROPONINIHS 2,040* 1,896*  --   --   --   --   --   --    < > = values in this interval not displayed.    Estimated Creatinine Clearance: 41.6 mL/min (A) (by C-G formula based on SCr of 1.06 mg/dL (H)).   Assessment: KE is a 77 year old F admitted after a fall found to have elevated troponin at 2040. Patient reports no medications prior to admission. Patient found to have approximately 3 cm occipital scalp laceration with minimal bleeding. Will round heparin doses down due to bleed.  Heparin previously initiated utilizing ACS protocol. MRI revealing Acute right frontal cortical and subcortical infarct.   Heparin level remains therapeutic at 0.36. No noted signs or symptoms of bleeding.  Goal of Therapy:  Heparin level 0.3-0.5 units/ml Monitor platelets by anticoagulation protocol: Yes   Plan:  Continue heparin infusion at 800 units/hr Check heparin level daily while on heparin Continue to monitor H&H and platelets  Thank you, Varney Daily, PharmD PGY2 Pharmacy Resident  Please check AMION for all [facility] pharmacy phone numbers After 10:00 PM call main pharmacy [phone]

## 2022-12-01 NOTE — TOC CAGE-AID Note (Signed)
Transition of Care [facility]) - CAGE-AID Screening  Patient Details  Name: Wanda Howell MRN: [id number] Date of Birth: [birth year]  Clinical Narrative:  Patient to ED after a fall, MRI showing acute stroke. Patient declined participating in screening. Does endorse alcohol use, approximately 1 bottle of wine/week. Patient does not use any illicit drugs. No resources given.  CAGE-AID Screening: Substance Abuse Screening unable to be completed due to: : Patient Refused

## 2022-12-03 ENCOUNTER — Other Ambulatory Visit (HOSPITAL_COMMUNITY): Payer: Self-pay

## 2022-12-04 ENCOUNTER — Other Ambulatory Visit (HOSPITAL_COMMUNITY): Payer: Self-pay

## 2022-12-13 ENCOUNTER — Encounter: Payer: Medicare Other | Admitting: Internal Medicine

## 2023-01-03 ENCOUNTER — Inpatient Hospital Stay: Payer: Medicare Other | Admitting: Adult Health

## 2023-02-06 ENCOUNTER — Ambulatory Visit (HOSPITAL_BASED_OUTPATIENT_CLINIC_OR_DEPARTMENT_OTHER): Payer: Medicare Other | Admitting: Cardiology

## 2023-03-01 ENCOUNTER — Other Ambulatory Visit: Payer: Self-pay | Admitting: Student

## 2023-03-05 NOTE — Telephone Encounter (Signed)
Call to patient.  Message left that Clinics ad called.  Informed patent that she will need to schedule an appointment in the Clinics before we will  be able to refill her meds if she plans to be a patient in the Internal Medicine Clinic.If she has being followed by another Primary care doctor  they should be able to do her refills.

## 2024-10-14 ENCOUNTER — Encounter (HOSPITAL_COMMUNITY): Payer: Self-pay | Admitting: Internal Medicine

## 2024-10-14 ENCOUNTER — Observation Stay (HOSPITAL_COMMUNITY)
Admission: EM | Admit: 2024-10-14 | Source: Home / Self Care | Attending: Emergency Medicine | Admitting: Emergency Medicine

## 2024-10-14 ENCOUNTER — Emergency Department (HOSPITAL_COMMUNITY)

## 2024-10-14 ENCOUNTER — Other Ambulatory Visit: Payer: Self-pay

## 2024-10-14 DIAGNOSIS — K921 Melena: Secondary | ICD-10-CM | POA: Diagnosis present

## 2024-10-14 DIAGNOSIS — N1831 Chronic kidney disease, stage 3a: Secondary | ICD-10-CM | POA: Diagnosis present

## 2024-10-14 DIAGNOSIS — E785 Hyperlipidemia, unspecified: Secondary | ICD-10-CM | POA: Diagnosis present

## 2024-10-14 DIAGNOSIS — I4891 Unspecified atrial fibrillation: Secondary | ICD-10-CM | POA: Diagnosis not present

## 2024-10-14 DIAGNOSIS — I2510 Atherosclerotic heart disease of native coronary artery without angina pectoris: Secondary | ICD-10-CM | POA: Diagnosis present

## 2024-10-14 DIAGNOSIS — Z8673 Personal history of transient ischemic attack (TIA), and cerebral infarction without residual deficits: Secondary | ICD-10-CM

## 2024-10-14 DIAGNOSIS — D7589 Other specified diseases of blood and blood-forming organs: Secondary | ICD-10-CM | POA: Diagnosis present

## 2024-10-14 DIAGNOSIS — R7989 Other specified abnormal findings of blood chemistry: Secondary | ICD-10-CM | POA: Diagnosis present

## 2024-10-14 DIAGNOSIS — I10 Essential (primary) hypertension: Secondary | ICD-10-CM | POA: Diagnosis present

## 2024-10-14 DIAGNOSIS — L8990 Pressure ulcer of unspecified site, unspecified stage: Secondary | ICD-10-CM | POA: Diagnosis present

## 2024-10-14 LAB — BASIC METABOLIC PANEL WITH GFR
Anion gap: 18 — ABNORMAL HIGH (ref 5–15)
BUN: 32 mg/dL — ABNORMAL HIGH (ref 8–23)
CO2: 19 mmol/L — ABNORMAL LOW (ref 22–32)
Calcium: 9 mg/dL (ref 8.9–10.3)
Chloride: 108 mmol/L (ref 98–111)
Creatinine, Ser: 1.71 mg/dL — ABNORMAL HIGH (ref 0.44–1.00)
GFR, Estimated: 30 mL/min — ABNORMAL LOW
Glucose, Bld: 134 mg/dL — ABNORMAL HIGH (ref 70–99)
Potassium: 3.5 mmol/L (ref 3.5–5.1)
Sodium: 145 mmol/L (ref 135–145)

## 2024-10-14 LAB — CBC
HCT: 42.3 % (ref 36.0–46.0)
Hemoglobin: 13.2 g/dL (ref 12.0–15.0)
MCH: 34.5 pg — ABNORMAL HIGH (ref 26.0–34.0)
MCHC: 31.2 g/dL (ref 30.0–36.0)
MCV: 110.4 fL — ABNORMAL HIGH (ref 80.0–100.0)
Platelets: 208 10*3/uL (ref 150–400)
RBC: 3.83 MIL/uL — ABNORMAL LOW (ref 3.87–5.11)
RDW: 12.9 % (ref 11.5–15.5)
WBC: 14.4 10*3/uL — ABNORMAL HIGH (ref 4.0–10.5)
nRBC: 0 % (ref 0.0–0.2)

## 2024-10-14 LAB — TROPONIN T, HIGH SENSITIVITY
Troponin T High Sensitivity: 38 ng/L — ABNORMAL HIGH (ref 0–19)
Troponin T High Sensitivity: 36 ng/L — ABNORMAL HIGH (ref 0–19)

## 2024-10-14 LAB — TSH: TSH: 1.56 u[IU]/mL (ref 0.350–4.500)

## 2024-10-14 LAB — POC OCCULT BLOOD, ED: Fecal Occult Bld: POSITIVE — AB

## 2024-10-14 LAB — I-STAT CHEM 8, ED
BUN: 37 mg/dL — ABNORMAL HIGH (ref 8–23)
Calcium, Ion: 1.04 mmol/L — ABNORMAL LOW (ref 1.15–1.40)
Chloride: 112 mmol/L — ABNORMAL HIGH (ref 98–111)
Creatinine, Ser: 1.8 mg/dL — ABNORMAL HIGH (ref 0.44–1.00)
Glucose, Bld: 130 mg/dL — ABNORMAL HIGH (ref 70–99)
HCT: 40 % (ref 36.0–46.0)
Hemoglobin: 13.6 g/dL (ref 12.0–15.0)
Potassium: 3.7 mmol/L (ref 3.5–5.1)
Sodium: 147 mmol/L — ABNORMAL HIGH (ref 135–145)
TCO2: 22 mmol/L (ref 22–32)

## 2024-10-14 LAB — MAGNESIUM: Magnesium: 1.5 mg/dL — ABNORMAL LOW (ref 1.7–2.4)

## 2024-10-14 LAB — PROTIME-INR
INR: 1 (ref 0.8–1.2)
Prothrombin Time: 14.2 s (ref 11.4–15.2)

## 2024-10-14 LAB — HEMOGLOBIN AND HEMATOCRIT, BLOOD
HCT: 43.3 % (ref 36.0–46.0)
Hemoglobin: 13.4 g/dL (ref 12.0–15.0)

## 2024-10-14 LAB — HEPATIC FUNCTION PANEL
ALT: 16 U/L (ref 0–44)
AST: 33 U/L (ref 15–41)
Albumin: 3.8 g/dL (ref 3.5–5.0)
Alkaline Phosphatase: 89 U/L (ref 38–126)
Bilirubin, Direct: 0.3 mg/dL — ABNORMAL HIGH (ref 0.0–0.2)
Indirect Bilirubin: 0.4 mg/dL (ref 0.3–0.9)
Total Bilirubin: 0.8 mg/dL (ref 0.0–1.2)
Total Protein: 7.4 g/dL (ref 6.5–8.1)

## 2024-10-14 LAB — CK: Total CK: 356 U/L — ABNORMAL HIGH (ref 38–234)

## 2024-10-14 LAB — MRSA NEXT GEN BY PCR, NASAL: MRSA by PCR Next Gen: NOT DETECTED

## 2024-10-14 MED ORDER — ONDANSETRON HCL 4 MG/2ML IJ SOLN: 4.0000 mg | Freq: Four times a day (QID) | INTRAMUSCULAR | Status: AC | PRN

## 2024-10-14 MED ORDER — ORAL CARE MOUTH RINSE: 15.0000 mL | OROMUCOSAL | Status: AC | PRN

## 2024-10-14 MED ORDER — LACTATED RINGERS IV SOLN: INTRAVENOUS | Status: DC

## 2024-10-14 MED ORDER — ONDANSETRON HCL 4 MG PO TABS: 4.0000 mg | ORAL_TABLET | Freq: Four times a day (QID) | ORAL | Status: AC | PRN

## 2024-10-14 MED ORDER — MAGNESIUM SULFATE 2 GM/50ML IV SOLN
2.0000 g | Freq: Once | INTRAVENOUS | Status: AC
  Administered 2024-10-14: 2 g via INTRAVENOUS
  Filled 2024-10-14: qty 50

## 2024-10-14 MED ORDER — ADULT MULTIVITAMIN W/MINERALS CH
1.0000 | ORAL_TABLET | Freq: Every day | ORAL | Status: AC
  Administered 2024-10-14 – 2024-10-16 (×2): 1 via ORAL
  Filled 2024-10-14 (×2): qty 1

## 2024-10-14 MED ORDER — LACTATED RINGERS IV BOLUS
1000.0000 mL | Freq: Once | INTRAVENOUS | Status: AC
  Administered 2024-10-14: 1000 mL via INTRAVENOUS

## 2024-10-14 MED ORDER — THIAMINE HCL 100 MG/ML IJ SOLN: 100.0000 mg | Freq: Every day | INTRAMUSCULAR | Status: AC

## 2024-10-14 MED ORDER — ACETAMINOPHEN 325 MG PO TABS: 650.0000 mg | ORAL_TABLET | Freq: Four times a day (QID) | ORAL | Status: AC | PRN

## 2024-10-14 MED ORDER — DILTIAZEM HCL-DEXTROSE 125-5 MG/125ML-% IV SOLN (PREMIX)
5.0000 mg/h | INTRAVENOUS | Status: DC
  Administered 2024-10-14 – 2024-10-15 (×2): 5 mg/h via INTRAVENOUS
  Administered 2024-10-16: 7.5 mg/h via INTRAVENOUS
  Filled 2024-10-14 (×3): qty 125

## 2024-10-14 MED ORDER — THIAMINE MONONITRATE 100 MG PO TABS
100.0000 mg | ORAL_TABLET | Freq: Every day | ORAL | Status: AC
  Administered 2024-10-14 – 2024-10-16 (×2): 100 mg via ORAL
  Filled 2024-10-14 (×2): qty 1

## 2024-10-14 MED ORDER — LORAZEPAM 1 MG PO TABS: 1.0000 mg | ORAL_TABLET | ORAL | Status: AC | PRN

## 2024-10-14 MED ORDER — ACETAMINOPHEN 650 MG RE SUPP: 650.0000 mg | Freq: Four times a day (QID) | RECTAL | Status: AC | PRN

## 2024-10-14 MED ORDER — FOLIC ACID 1 MG PO TABS
1.0000 mg | ORAL_TABLET | Freq: Every day | ORAL | Status: AC
  Administered 2024-10-14 – 2024-10-16 (×2): 1 mg via ORAL
  Filled 2024-10-14 (×2): qty 1

## 2024-10-14 MED ORDER — SODIUM CHLORIDE 0.9 % IV SOLN: INTRAVENOUS | Status: AC

## 2024-10-14 MED ORDER — POTASSIUM CHLORIDE CRYS ER 20 MEQ PO TBCR: 20.0000 meq | EXTENDED_RELEASE_TABLET | Freq: Once | ORAL | Status: DC

## 2024-10-14 MED ORDER — LORAZEPAM 1 MG PO TABS: 0.0000 mg | ORAL_TABLET | ORAL | Status: AC

## 2024-10-14 MED ORDER — POTASSIUM CHLORIDE 20 MEQ PO PACK
20.0000 meq | PACK | Freq: Once | ORAL | Status: AC
  Administered 2024-10-14: 20 meq via ORAL
  Filled 2024-10-14: qty 1

## 2024-10-14 MED ORDER — LORAZEPAM 1 MG PO TABS: 0.0000 mg | ORAL_TABLET | Freq: Three times a day (TID) | ORAL | Status: AC

## 2024-10-14 MED ORDER — CHLORHEXIDINE GLUCONATE CLOTH 2 % EX PADS
6.0000 | MEDICATED_PAD | Freq: Every day | CUTANEOUS | Status: AC
  Administered 2024-10-14 – 2024-10-16 (×3): 6 via TOPICAL

## 2024-10-14 MED ORDER — DILTIAZEM LOAD VIA INFUSION
20.0000 mg | Freq: Once | INTRAVENOUS | Status: AC
  Administered 2024-10-14: 20 mg via INTRAVENOUS
  Filled 2024-10-14: qty 20

## 2024-10-14 MED ORDER — PANTOPRAZOLE SODIUM 40 MG PO TBEC
40.0000 mg | DELAYED_RELEASE_TABLET | Freq: Every day | ORAL | Status: AC
  Administered 2024-10-14 – 2024-10-16 (×3): 40 mg via ORAL
  Filled 2024-10-14 (×3): qty 1

## 2024-10-14 MED ORDER — LORAZEPAM 2 MG/ML IJ SOLN: 1.0000 mg | INTRAMUSCULAR | Status: AC | PRN

## 2024-10-14 NOTE — Consult Note (Addendum)
 "  Cardiology Consultation   Patient ID: Wanda Howell MRN: [id number]; DOB: [birth year]  Admit date: 10/14/2024 Date of Consult: 10/14/2024  PCP:  Patient, No Pcp Per   [facility] Providers Cardiologist:  Shelda Bruckner, MD        Patient Profile: Wanda Howell [facility]) is a 79 y.o. female with a hx of CVA, elevated troponin, pubic ramus fracture, osteoporosis, vitamin D  deficiency, habitual alcohol intake (2-3 glasses of wine nightly) who is being seen 10/14/2024 for the evaluation of AFib at the request of Dr. Celinda.  History of Present Illness: Wanda Howell has limited interaction with the medical world per chart. She was admitted in 11/2022 with acute CVA having had limited interaction with doctors in the prior 20 years. She was out of window for TPA. Cardiology was consulted for troponin peak 2040 with chest pain the day prior to presentation. The patient did not wish to pursue cath. She also declined event monitor and lower extremity dopplers. 2D echo showed EF 65-70%, mild LVH, trivial MR. Neurolology recommended ASA + Plavix  x 3 weeks then ASA alone. She cancelled all post-hospital visits. She has only been taking ASA recently per her report. She lives by herself and does not have any family nearby.  She presented to [facility] with weakness. She states she typically has to use a walker but the last few days has been feeling stronger so decided to go with out it. She does admit that she hasn't eaten more than a Pepsi and a pack of nabs a day for the past 6 days due to poor appetite (I have [employer] in the freezer but just didn't feel like eating). In the middle of the night she got up to use the bathroom. While walking back down the hallway she felt weak like she could not continue so lowered herself to the ground. She could not subsequently get up. She alerted her neighbors by yelling. Per notes, when EMS arrived they noted patient had dark tarry stools all over her  apartment which patient reportedly had not noticed. EMS BP 100/60. She was given 500cc IVF and brought to ER. She was in AF RVR on arrival with HR 150s-160s. Labs show AKI with BUN/Cr 32/1.71, CK 356, troponins low/flat at 38-36, leukocytosis of 14.4, Hgb 13.2, FOBT+. She was given additional IVF in the ED and started on IV diltiazem  with improvement in HR to the 90s. She may have felt some palpitations yesterday when hollering for her neighbor but otherwise not recently. No CP, SOB, edema, orthopnea, syncope.   Past Medical History:  Diagnosis Date   Elevated troponin 11/30/2022   History of CVA (cerebrovascular accident) 10/14/2024   Vitamin D  deficiency 12/01/2022    No past surgical history on file.   Home Medications:  ASA 81mg  daily  Scheduled Meds:  potassium chloride   20 mEq Oral Once   Continuous Infusions:  diltiazem  (CARDIZEM ) infusion 7.5 mg/hr (10/14/24 1135)   PRN Meds:  Allergies:   Allergies[1]  Social History:   Social History   Socioeconomic History   Marital status: Widowed    Spouse name: Not on file   Number of children: Not on file   Years of education: Not on file   Highest education level: Not on file  Occupational History   Not on file  Tobacco Use   Smoking status: Never   Smokeless tobacco: Not on file  Substance and Sexual Activity   Alcohol use: Yes    Comment: 2-3  glasses of wine nightly, sometimes additional bourbon   Drug use: No   Sexual activity: Not on file  Other Topics Concern   Not on file  Social History Narrative   Not on file   Social Drivers of Health   Tobacco Use: Unknown (10/14/2024)   Patient History    Smoking Tobacco Use: Never    Smokeless Tobacco Use: Unknown    Passive Exposure: Not on file  Financial Resource Strain: Not on file  Food Insecurity: No Food Insecurity (11/29/2022)   Hunger Vital Sign    Worried About Running Out of Food in the Last Year: Never true    Ran Out of Food in the Last Year: Never true   Transportation Needs: No Transportation Needs (11/29/2022)   PRAPARE - Administrator, Civil Service (Medical): No    Lack of Transportation (Non-Medical): No  Physical Activity: Not on file  Stress: Not on file  Social Connections: Not on file  Intimate Partner Violence: Not At Risk (11/29/2022)   Humiliation, Afraid, Rape, and Kick questionnaire    Fear of Current or Ex-Partner: No    Emotionally Abused: No    Physically Abused: No    Sexually Abused: No  Depression (PHQ2-9): Not on file  Alcohol Screen: Not on file  Housing: Low Risk (11/29/2022)   Housing    Last Housing Risk Score: 0  Utilities: Not At Risk (11/29/2022)   AHC Utilities    Threatened with loss of utilities: No  Health Literacy: Not on file    Family History:    Family History  Problem Relation Age of Onset   Congestive Heart Failure Father    CAD Neg Hx      ROS:  Please see the history of present illness.   All other ROS reviewed and negative.     Physical Exam/Data: Vitals:   10/14/24 1112 10/14/24 1130 10/14/24 1245 10/14/24 1417  BP: 115/83 113/82 125/78 (!) 141/57  Pulse: (!) 102 94 92 92  Resp: (!) 28 (!) 29 (!) 25 20  Temp: 97.9 F (36.6 C)   97.8 F (36.6 C)  TempSrc:    Oral  SpO2: 96% 95% 98% 99%   No intake or output data in the 24 hours ending 10/14/24 1431    11/29/2022    6:15 PM  Last 3 Weights  Weight (lbs) 128 lb 8.5 oz  Weight (kg) 58.3 kg     There is no height or weight on file to calculate BMI.  General: Frail appearing WF in no acute distress. Head: Normocephalic, atraumatic, sclera mildly icteric, no xanthomas, nares are without discharge. Neck: Negative for carotid bruits. JVP not elevated. Lungs: Clear bilaterally to auscultation without wheezes, rales, or rhonchi. Breathing is unlabored. Heart: Irregularly irregular, S1 S2 without murmurs, rubs, or gallops.  Abdomen: Soft, non-tender, non-distended with normoactive bowel sounds. No  rebound/guarding. Extremities: No clubbing or cyanosis. No edema. Distal pedal pulses are 2+ and equal bilaterally. Redness upper left shoulder c/w pressure injury Neuro: Alert and oriented X 3. Moves all extremities spontaneously. Psych:  Responds to questions appropriately with a normal affect.   EKG:  The EKG was personally reviewed and demonstrates:  atrial fib RVR 168bpm, diffuse TW changes in similar territory seen in 2024  Telemetry:  Telemetry was personally reviewed and demonstrates:  AFib  Relevant CV Studies:  2D echo 11/2022   1. Left ventricular ejection fraction, by estimation, is 65 to 70%. The  left ventricle has  normal function. The left ventricle demonstrates  grossly normal wall motion with contrast enhancement.   2. There is mild left ventricular hypertrophy. Left ventricular diastolic  parameters were normal.   3. Right ventricular systolic function is normal. The right ventricular  size is normal.   4. The mitral valve is abnormal. Trivial mitral valve regurgitation.   5. The aortic valve was not well visualized. Aortic valve regurgitation  is not visualized. Aortic valve sclerosis is present, with no evidence of  aortic valve stenosis.   6. The inferior vena cava is normal in size with greater than 50%  respiratory variability, suggesting right atrial pressure of 3 mmHg.   Comparison(s): No prior Echocardiogram.    Laboratory Data: High Sensitivity Troponin:  No results for input(s): TROPONINIHS in the last 720 hours.  Recent Labs  Lab 10/14/24 0820 10/14/24 1036  TRNPT 38* 36*      Chemistry Recent Labs  Lab 10/14/24 0820 10/14/24 0852  NA 145 147*  K 3.5 3.7  CL 108 112*  CO2 19*  --   GLUCOSE 134* 130*  BUN 32* 37*  CREATININE 1.71* 1.80*  CALCIUM  9.0  --   GFRNONAA 30*  --   ANIONGAP 18*  --     No results for input(s): PROT, ALBUMIN, AST, ALT, ALKPHOS, BILITOT in the last 168 hours. Lipids No results for input(s):  CHOL, TRIG, HDL, LABVLDL, LDLCALC, CHOLHDL in the last 168 hours.  Hematology Recent Labs  Lab 10/14/24 0820 10/14/24 0852  WBC 14.4*  --   RBC 3.83*  --   HGB 13.2 13.6  HCT 42.3 40.0  MCV 110.4*  --   MCH 34.5*  --   MCHC 31.2  --   RDW 12.9  --   PLT 208  --    Thyroid  Recent Labs  Lab 10/14/24 0820  TSH 1.560    BNPNo results for input(s): BNP, PROBNP in the last 168 hours.  DDimer No results for input(s): DDIMER in the last 168 hours.  Radiology/Studies:  No results found.   Assessment and Plan:  1. Weakness, inability to get up from the ground, elevated CK level, AKI, with melena/suspected GIB - Patient reports she has had poor appetite the last 6 days, eating only Nabs and Pepsi - appears cachectic. Also has some pressure injuries on upper extremities. Lowered herself to the ground overnight and couldn't get up, calling for neighbor - treated with IVF - initial Hgb OK but suspect hemoconcentrated, repeat pending - GI workup navigated per primary team, hold off anticoagulation at this time (taking ASA 81mg  daily PTA) - OK to hold statin for now  2. Newly recognized atrial fibrillation - duration unclear, does not really perceive this rhythm - TSH OK - provoking factors as above - defer anticoagulation at this time in setting of concern for GIB, will also need to emphasize compliance if this is considered going forward - does not really follow in the outpatient setting - continue IV diltiazem  for now given HR 90s - can consider changing to oral form in AM if HR remains stable/does not convert to NSR - repeat echocardiogram to assess LVEF  3. Habitual alcohol intake - notified IM drinks 2-3 glasses of wine daily (rare bourbon), may need CIWA - check LFTs, PT/INR  4. History of stroke - defer blood thinner therapy at this time in setting of GIB - await LFTs before restarting statin - recheck CK in AM as well  5. Elevated troponin in  2024 -  denies angina or dyspnea - she declined cath at that time, not a good time to revisit  Suspect will need to explore living situation at [place]. Unclear that she is capable of caring for herself adequately.  Risk Assessment/Risk Scores:         CHA2DS2-VASc Score = 5   This indicates a 7.2% annual risk of stroke. The patient's score is based upon: CHF History: 0 HTN History: 0 Diabetes History: 0 Stroke History: 2 Vascular Disease History: 0 Age Score: 2 Gender Score: 1      For questions or updates, please contact [facility] Please consult [email or web address] for contact info under      Signed, Raphael LOISE Bring, PA-C  10/14/2024 2:31 PM     [1] No Known Allergies  "

## 2024-10-14 NOTE — ED Provider Notes (Addendum)
 " [place] EMERGENCY DEPARTMENT AT [facility] Provider Note   CSN: [id number] Arrival date & time: 10/14/24  9188     Patient presents with: Tachycardia   Wanda Howell is a 79 y.o. female.   79 year old female presents from home with weakness and falls.  Patient states that she mechanical fall yesterday.  Does need to use a walker but has not been using 1.  Notes that she did not strike her head or having loss of consciousness.  States that she has had some pain in her left lower extremity which is unchanged from prior.  Denies any chest pain or palpitations.  Is unaware of any black stools.  Currently does not have any headache or neck pain.  No hip discomfort.  No chest or abdominal comfort.  Patient alerted her neighbors by yelling.  EMS was called and patient found to have dark tarry stools.  She was in A-fib with RVR.  She has no prior history of same.  She does take aspirin  daily.  Was given 500 cc of saline and transported here.       Prior to Admission medications  Medication Sig Start Date End Date Taking? Authorizing Provider  atorvastatin  (LIPITOR) 40 MG tablet Take 1 tablet (40 mg total) by mouth daily. 12/01/22 03/01/23  Nguyen, Quan, DO  lisinopril  (ZESTRIL ) 10 MG tablet Take 1 tablet (10 mg total) by mouth daily. 12/01/22 03/01/23  Nguyen, Quan, DO    Allergies: Patient has no known allergies.    Review of Systems  All other systems reviewed and are negative.   Updated Vital Signs BP (!) 120/92 (BP Location: Left Arm)   Pulse (!) 164   Temp 97.6 F (36.4 C) (Oral)   Resp 20   SpO2 97%   Physical Exam Vitals and nursing note reviewed.  Constitutional:      General: She is not in acute distress.    Appearance: Normal appearance. She is well-developed. She is not toxic-appearing.  HENT:     Head: Normocephalic and atraumatic.  Eyes:     General: Lids are normal.     Conjunctiva/sclera: Conjunctivae normal.     Pupils: Pupils are equal, round, and  reactive to light.  Neck:     Thyroid: No thyroid mass.     Trachea: No tracheal deviation.  Cardiovascular:     Rate and Rhythm: Tachycardia present. Rhythm irregular.     Heart sounds: Normal heart sounds. No murmur heard.    No gallop.  Pulmonary:     Effort: Pulmonary effort is normal. No respiratory distress.     Breath sounds: Normal breath sounds. No stridor. No decreased breath sounds, wheezing, rhonchi or rales.  Abdominal:     General: There is no distension.     Palpations: Abdomen is soft.     Tenderness: There is no abdominal tenderness. There is no rebound.  Musculoskeletal:        General: No tenderness. Normal range of motion.     Cervical back: Normal range of motion and neck supple.     Comments: No shortening or rotation of her lower extremities.  Skin:    General: Skin is warm and dry.     Findings: No abrasion or rash.  Neurological:     Mental Status: She is alert and oriented to person, place, and time. Mental status is at baseline.     GCS: GCS eye subscore is 4. GCS verbal subscore is 5. GCS motor subscore  is 6.     Cranial Nerves: No cranial nerve deficit.     Sensory: No sensory deficit.     Motor: Motor function is intact.     Comments: Patient's left lower extremity strength 4/ 5.  Remaining extremity strengths are normal.  She has no facial asymmetry.  No facial droop.  Psychiatric:        Attention and Perception: Attention normal.        Speech: Speech normal.        Behavior: Behavior normal.     (all labs ordered are listed, but only abnormal results are displayed) Labs Reviewed  I-STAT CHEM 8, ED - Abnormal; Notable for the following components:      Result Value   Sodium 147 (*)    Chloride 112 (*)    BUN 37 (*)    Creatinine, Ser 1.80 (*)    Glucose, Bld 130 (*)    Calcium , Ion 1.04 (*)    All other components within normal limits  BASIC METABOLIC PANEL WITH GFR  CBC  CK  URINALYSIS, W/ REFLEX TO CULTURE (INFECTION SUSPECTED)   TSH  POC OCCULT BLOOD, ED  TROPONIN T, HIGH SENSITIVITY    EKG: EKG Interpretation Date/Time:  Wednesday October 14 2024 08:19:58 EST Ventricular Rate:  168 PR Interval:    QRS Duration:  93 QT Interval:  281 QTC Calculation: 470 R Axis:   88  Text Interpretation: Atrial fibrillation with rapid V-rate Borderline right axis deviation Repolarization abnormality, prob rate related Confirmed by Dasie Faden ([id number]) on 10/14/2024 8:26:31 AM  Radiology: No results found.   Procedures   Medications Ordered in the ED  diltiazem  (CARDIZEM ) 1 mg/mL load via infusion 20 mg (20 mg Intravenous Bolus from Bag 10/14/24 0854)    And  diltiazem  (CARDIZEM ) 125 mg in dextrose  5% 125 mL (1 mg/mL) infusion (5 mg/hr Intravenous New Bag/Given 10/14/24 0853)  lactated ringers  bolus 1,000 mL (1,000 mLs Intravenous New Bag/Given 10/14/24 0850)                                    Medical Decision Making Amount and/or Complexity of Data Reviewed Labs: ordered. Radiology: ordered.  Risk Prescription drug management. Decision regarding hospitalization.   Patient was in A-fib with RVR prior to arrival per his EKG..  Patient offered head CT to evaluate for possible CVA due to patient being in A-fib as she has deferred.  Responded well to Cardizem  IV bolus as well as on Cardizem  drip.  Patient's fecal occult positive.  Does have evidence of acute kidney injury as well 2.  Patient was given IV fluids.  Hemoglobin stable at 13.2.  Patient had fallen and total CK was normal.  TSH normal.  Patient will require hospitalization.  Will consult hospitalist  CRITICAL CARE Performed by: Faden ONEIDA Dasie Total critical care time: 55 minutes Critical care time was exclusive of separately billable procedures and treating other patients. Critical care was necessary to treat or prevent imminent or life-threatening deterioration. Critical care was time spent personally by me on the following activities: development of  treatment plan with patient and/or surrogate as well as nursing, discussions with consultants, evaluation of patient's response to treatment, examination of patient, obtaining history from patient or surrogate, ordering and performing treatments and interventions, ordering and review of laboratory studies, ordering and review of radiographic studies, pulse oximetry and re-evaluation of patient's condition.  Final diagnoses:  None    ED Discharge Orders     None          Dasie Faden, MD 10/14/24 1239    Dasie Faden, MD 10/14/24 1249  "
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
## 2024-10-14 NOTE — ED Triage Notes (Addendum)
 BIB EMS from home for weakness, possible falls, was heard yelling from her apartment. Per EMS, black tarry stools were all over her apartment. In afib RVR for EMS. Patient is alert and oriented  Takes 81mg  aspirin  daily, has not seen a pcp in a 'long time' and lives alone. 500cc bolus Afib rvr 160's  100/60

## 2024-10-14 NOTE — Plan of Care (Signed)
  Problem: Nutrition: Goal: Adequate nutrition will be maintained Outcome: Progressing   Problem: Coping: Goal: Level of anxiety will decrease Outcome: Progressing   Problem: Elimination: Goal: Will not experience complications related to urinary retention Outcome: Progressing   Problem: Pain Managment: Goal: General experience of comfort will improve and/or be controlled Outcome: Progressing

## 2024-10-14 NOTE — H&P (Signed)
 " History and Physical    Patient: Wanda Howell FMW:[id number] DOB: 04-23-1946 DOA: 10/14/2024 DOS: the patient was seen and examined on 10/14/2024 PCP: Patient, No Pcp Per  Patient coming from: Home  Chief Complaint:  Chief Complaint  Patient presents with   Tachycardia   HPI: Wanda Howell is a 79 y.o. female with medical history significant of hypertension, hyperlipidemia, CAD, history of NSTEMI, history of CVA, alcohol abuse who was brought to the emergency department BIBEMS due to falls, generalized weakness.  EMS found the black tarry stools all over her apartment.  Patient stated that she lost strength and lowered herself to the floor to avoid injury.  However, she was unable to get up and was on the floor for several hours screaming for help.  She drinks several glasses of wine every day or 3 times a week.  She also drinks scotch 1 or 2 drinks a couple times a week.  She denied fever, chills, rhinorrhea, sore throat, wheezing or hemoptysis.  No chest pain, palpitations, diaphoresis, PND, orthopnea or pitting edema of the lower extremities.  No abdominal pain, nausea, emesis, diarrhea, constipation, melena or hematochezia.  No flank pain, dysuria, frequency or hematuria.  No polyuria, polydipsia, polyphagia or blurred vision.   Lab work: Fecal occult blood was positive.  CBC showed white count of 14.4, hemoglobin 13.2 g/dL with an MCV of 889.5 fL and platelets 308.  Troponin was 38 then 36 ng/L.  Total CK3 156 units/L.  BMP showed a CO2 of 19 mmol/L with an anion gap of 18, the rest of the electrolytes were normal.  Glucose 134, BUN 32 and creatinine 1.71 mg/dL.  About 22+ months ago her creatinine level was 1.06 mg/dL.  ED course: Initial vital signs were temperature 97.6 F, pulse 164, respirations 20, BP 120/92 mmHg and O2 sat 97% on room air.  The patient was started on a Cardizem  infusion after 20 mg bolus dose and also received 1000 mL of LR.   Review of Systems: As mentioned in the  history of present illness. All other systems reviewed and are negative.  No past medical history on file. No past surgical history on file. Social History:  reports that she has never smoked. She does not have any smokeless tobacco history on file. She reports current alcohol use. She reports that she does not use drugs.  Allergies[1]  No family history on file.  Prior to Admission medications  Medication Sig Start Date End Date Taking? Authorizing Provider  atorvastatin  (LIPITOR) 40 MG tablet Take 1 tablet (40 mg total) by mouth daily. 12/01/22 03/01/23  Nguyen, Quan, DO  lisinopril  (ZESTRIL ) 10 MG tablet Take 1 tablet (10 mg total) by mouth daily. 12/01/22 03/01/23  Leontine Elbe, DO    Physical Exam: Vitals:   10/14/24 0818 10/14/24 0908 10/14/24 1112 10/14/24 1130  BP: (!) 120/92  115/83 113/82  Pulse: (!) 164 92 (!) 102 94  Resp: 20 20 (!) 28 (!) 29  Temp: 97.6 F (36.4 C)  97.9 F (36.6 C)   TempSrc: Oral     SpO2: 97%  96% 95%   Physical Exam Vitals and nursing note reviewed.  Constitutional:      General: She is awake. She is not in acute distress.    Appearance: She is ill-appearing.  HENT:     Head: Normocephalic.     Nose: No rhinorrhea.     Mouth/Throat:     Mouth: Mucous membranes are moist.  Eyes:  General: No scleral icterus.    Pupils: Pupils are equal, round, and reactive to light.  Neck:     Vascular: No JVD.  Cardiovascular:     Rate and Rhythm: Normal rate and regular rhythm.     Heart sounds: S1 normal and S2 normal.  Pulmonary:     Effort: Pulmonary effort is normal.     Breath sounds: Normal breath sounds. No wheezing, rhonchi or rales.  Abdominal:     General: Bowel sounds are normal. There is no distension.     Palpations: Abdomen is soft.     Tenderness: There is no abdominal tenderness. There is no right CVA tenderness or left CVA tenderness.  Musculoskeletal:     Cervical back: Neck supple.     Right lower leg: No edema.     Left  lower leg: No edema.  Skin:    General: Skin is warm and dry.  Neurological:     General: No focal deficit present.     Mental Status: She is alert and oriented to person, place, and time.  Psychiatric:        Mood and Affect: Mood normal.        Behavior: Behavior normal. Behavior is cooperative.     Data Reviewed:  Results are pending, will review when available.  06/02/2023  IMPRESSIONS:   1. Left ventricular ejection fraction, by estimation, is 65 to 70%. The  left ventricle has normal function. The left ventricle demonstrates  grossly normal wall motion with contrast enhancement.   2. There is mild left ventricular hypertrophy. Left ventricular diastolic  parameters were normal.   3. Right ventricular systolic function is normal. The right ventricular  size is normal.   4. The mitral valve is abnormal. Trivial mitral valve regurgitation.   5. The aortic valve was not well visualized. Aortic valve regurgitation  is not visualized. Aortic valve sclerosis is present, with no evidence of  aortic valve stenosis.   6. The inferior vena cava is normal in size with greater than 50%  respiratory variability, suggesting right atrial pressure of 3 mmHg.   Comparison(s): No prior Echocardiogram.   EKG: Vent. rate 86 BPM PR interval * ms QRS duration 95 ms QT/QTcB 357/427 ms P-R-T axes * 90 -76 Atrial fibrillation Borderline right axis deviation Repol abnrm suggests ischemia, anterolateral  Assessment and Plan: Principal Problem:   Atrial fibrillation with RVR (HCC) CHA2DS2-VASc score = 6  Observation/SDU. Continue diltiazem  infusion. Keep electrolytes optimized. Check echocardiogram. Cardiology consult appreciated. Anticoagulation held due to melena. -Will weigh GI assessment.  Active Problems:   Melena Monitor hematocrit and hemoglobin. Transfuse as needed. GI consult appreciated.    Elevated serum creatinine In a patient with history of:   Chronic kidney  disease, stage 3a (HCC)  AKI versus disease progression. I will continue IV hydration. Monitor intake and output. Follow-up renal function in AM.    Elevated troponin Likely demand ischemia. Check echocardiogram.    CAD (coronary artery disease) Holding aspirin . May be off atorvastatin .    Hypertension On Cardizem  infusion. Hold lisinopril  pending med rec.    Hyperlipidemia Statin therapy is risky with EtOH use.    Macrocytosis In the setting of alcohol abuse. Continue MVI, folate and thiamine .    Hypomagnesemia In the setting of significant alcohol consumption. Magnesium  sulfate 2 g IVPB ordered.    History of CVA (cerebrovascular accident) Supportive care. Will hold aspirin  for now.    Pressure injury of skin Continue local care. Continue  preventive measures.     Advance Care Planning:   Code Status: Limited: Do not attempt resuscitation (DNR) -DNR-LIMITED -Do Not Intubate/DNI    Consults: Cardiology and gastroenterology.  Family Communication:   Severity of Illness: The appropriate patient status for this patient is INPATIENT. Inpatient status is judged to be reasonable and necessary in order to provide the required intensity of service to ensure the patient's safety. The patient's presenting symptoms, physical exam findings, and initial radiographic and laboratory data in the context of their chronic comorbidities is felt to place them at high risk for further clinical deterioration. Furthermore, it is not anticipated that the patient will be medically stable for discharge from the hospital within 2 midnights of admission.   * I certify that at the point of admission it is my clinical judgment that the patient will require inpatient hospital care spanning beyond 2 midnights from the point of admission due to high intensity of service, high risk for further deterioration and high frequency of surveillance required.*  Author: Alm Dorn Castor, MD 10/14/2024 12:46  PM  For on call review [email or web address].   This document was prepared using Dragon voice recognition software and may contain some unintended transcription errors.     [1] No Known Allergies  "

## 2024-10-15 ENCOUNTER — Observation Stay (HOSPITAL_COMMUNITY)

## 2024-10-15 ENCOUNTER — Observation Stay (HOSPITAL_COMMUNITY): Admitting: Anesthesiology

## 2024-10-15 ENCOUNTER — Encounter (HOSPITAL_COMMUNITY): Payer: Self-pay | Admitting: *Deleted

## 2024-10-15 ENCOUNTER — Encounter (HOSPITAL_COMMUNITY): Admission: EM | Payer: Self-pay | Source: Home / Self Care | Attending: Emergency Medicine

## 2024-10-15 DIAGNOSIS — I4891 Unspecified atrial fibrillation: Secondary | ICD-10-CM | POA: Diagnosis not present

## 2024-10-15 LAB — COMPREHENSIVE METABOLIC PANEL WITH GFR
ALT: 12 U/L (ref 0–44)
AST: 26 U/L (ref 15–41)
Albumin: 3.4 g/dL — ABNORMAL LOW (ref 3.5–5.0)
Alkaline Phosphatase: 75 U/L (ref 38–126)
Anion gap: 12 (ref 5–15)
BUN: 28 mg/dL — ABNORMAL HIGH (ref 8–23)
CO2: 21 mmol/L — ABNORMAL LOW (ref 22–32)
Calcium: 9 mg/dL (ref 8.9–10.3)
Chloride: 109 mmol/L (ref 98–111)
Creatinine, Ser: 1.35 mg/dL — ABNORMAL HIGH (ref 0.44–1.00)
GFR, Estimated: 40 mL/min — ABNORMAL LOW
Glucose, Bld: 93 mg/dL (ref 70–99)
Potassium: 4 mmol/L (ref 3.5–5.1)
Sodium: 143 mmol/L (ref 135–145)
Total Bilirubin: 0.7 mg/dL (ref 0.0–1.2)
Total Protein: 6.4 g/dL — ABNORMAL LOW (ref 6.5–8.1)

## 2024-10-15 LAB — CBC
HCT: 40.7 % (ref 36.0–46.0)
Hemoglobin: 12.6 g/dL (ref 12.0–15.0)
MCH: 34.1 pg — ABNORMAL HIGH (ref 26.0–34.0)
MCHC: 31 g/dL (ref 30.0–36.0)
MCV: 110.3 fL — ABNORMAL HIGH (ref 80.0–100.0)
Platelets: 157 10*3/uL (ref 150–400)
RBC: 3.69 MIL/uL — ABNORMAL LOW (ref 3.87–5.11)
RDW: 12.9 % (ref 11.5–15.5)
WBC: 9.4 10*3/uL (ref 4.0–10.5)
nRBC: 0 % (ref 0.0–0.2)

## 2024-10-15 LAB — ECHOCARDIOGRAM COMPLETE
Area-P 1/2: 4.68 cm2
Height: 70 in
S' Lateral: 3.3 cm
Weight: 2119.94 [oz_av]

## 2024-10-15 LAB — CK: Total CK: 229 U/L (ref 38–234)

## 2024-10-15 MED ORDER — LIDOCAINE 2% (20 MG/ML) 5 ML SYRINGE
INTRAMUSCULAR | Status: DC | PRN
  Administered 2024-10-15: 60 mg via INTRAVENOUS

## 2024-10-15 MED ORDER — PROPOFOL 500 MG/50ML IV EMUL
INTRAVENOUS | Status: DC | PRN
  Administered 2024-10-15: 80 ug/kg/min via INTRAVENOUS

## 2024-10-15 MED ORDER — PHENYLEPHRINE 80 MCG/ML (10ML) SYRINGE FOR IV PUSH (FOR BLOOD PRESSURE SUPPORT)
PREFILLED_SYRINGE | INTRAVENOUS | Status: DC | PRN
  Administered 2024-10-15 (×2): 160 ug via INTRAVENOUS

## 2024-10-15 MED ORDER — PROPOFOL 500 MG/50ML IV EMUL
INTRAVENOUS | Status: AC
  Filled 2024-10-15: qty 50

## 2024-10-15 MED ORDER — APIXABAN 5 MG PO TABS
5.0000 mg | ORAL_TABLET | Freq: Two times a day (BID) | ORAL | Status: AC
  Administered 2024-10-15 – 2024-10-16 (×3): 5 mg via ORAL
  Filled 2024-10-15 (×3): qty 1

## 2024-10-15 MED ORDER — PROPOFOL 10 MG/ML IV BOLUS
INTRAVENOUS | Status: DC | PRN
  Administered 2024-10-15 (×2): 10 mg via INTRAVENOUS

## 2024-10-15 MED ORDER — SODIUM CHLORIDE 0.9 % IV SOLN: INTRAVENOUS | Status: DC

## 2024-10-15 NOTE — Op Note (Signed)
 [facility] Patient Name: Wanda Howell Procedure Date: 10/15/2024 MRN: [id number] Attending MD: Oliva Boots , MD, [id number] Date of Birth: [birth year] CSN: [id number] Age: 79 Admit Type: Inpatient Procedure:                Upper GI endoscopy Indications:              Melena Providers:                Oliva Boots, MD, Hoy Penner, RN, Corky Czech,                            Technician, Garnette SAUNDERS. Alday CRNA, CRNA Referring MD:              Medicines:                Monitored Anesthesia Care Complications:            No immediate complications. Estimated Blood Loss:     Estimated blood loss: none. Estimated blood loss:                            none. Procedure:                Pre-Anesthesia Assessment:                           - Prior to the procedure, a History and Physical                            was performed, and patient medications and                            allergies were reviewed. The patient's tolerance of                            previous anesthesia was also reviewed. The risks                            and benefits of the procedure and the sedation                            options and risks were discussed with the patient.                            All questions were answered, and informed consent                            was obtained. Prior Anticoagulants: The patient has                            taken no anticoagulant or antiplatelet agents                            except for aspirin . ASA Grade Assessment: III - A  patient with severe systemic disease. After                            reviewing the risks and benefits, the patient was                            deemed in satisfactory condition to undergo the                            procedure.                           After obtaining informed consent, the endoscope was                            passed under direct vision. Throughout the                             procedure, the patient's blood pressure, pulse, and                            oxygen saturations were monitored continuously. The                            [id number]-[id number] ([id number]) Olympus endoscope was introduced                            through the mouth, and advanced to the third part                            of duodenum. The upper GI endoscopy was                            accomplished without difficulty. The patient                            tolerated the procedure well. Scope In: Scope Out: Findings:      A small hiatal hernia was present.      Localized minimal inflammation characterized by erythema was found in       the gastric antrum.      The duodenal bulb, first portion of the duodenum, second portion of the       duodenum and third portion of the duodenum were normal.      The cardia and gastric fundus were normal on retroflexion.      The cardia and gastric fundus were normal on retroflexion. Impression:               - Small hiatal hernia.                           - Caustic gastritis. Mild                           - Normal duodenal bulb, first portion of the  duodenum, second portion of the duodenum and third                            portion of the duodenum.                           - No specimens collected. Moderate Sedation:      Not Applicable - Patient had care per Anesthesia. Recommendation:           - Soft diet today.                           - Continue present medications. Okay with me to use                            blood thinners as per cardiology                           - Return to GI clinic PRN.                           - Telephone GI clinic if symptomatic PRN. Procedure Code(s):        --- Professional ---                           [phone], Esophagogastroduodenoscopy, flexible,                            transoral; diagnostic, including collection of                            specimen(s) by brushing or washing,  when performed                            (separate procedure) Diagnosis Code(s):        --- Professional ---                           K44.9, Diaphragmatic hernia without obstruction or                            gangrene                           T54.94XA, Toxic effect of unspecified corrosive                            substance, undetermined, initial encounter                           T28.7XXA, Corrosion of other parts of alimentary                            tract, initial encounter                           K92.1, Melena (includes Hematochezia) CPT copyright 2022 American  Medical Association. All rights reserved. The codes documented in this report are preliminary and upon coder review may  be revised to meet current compliance requirements. Oliva Boots, MD 10/15/2024 1:33:02 PM This report has been signed electronically. Number of Addenda: 0

## 2024-10-15 NOTE — Progress Notes (Signed)
 Wanda Howell 11:56 AM  Subjective: Patient seen and examined and she has no signs of bleeding and tolerated clear liquids and we again had a long conversation about the risk benefits methods of endoscopy and compared it to empiric acid blockers and barium swallow and upper GI and we answered all of her questions  Objective: Vital signs stable afebrile no acute distress abdomen is soft nontender exam please see preassessment evaluation BUN and creatinine decreased hemoglobin very slight drop assessment: Melena and patient on aspirin  today who will possibly require blood thinners  Plan: See above for our long conversation again about the procedure she is elected to proceed today with anesthesia assistance with further workup and plans pending those findings  [facility]  office [phone] After 5PM or if no answer call [phone]

## 2024-10-15 NOTE — Anesthesia Postprocedure Evaluation (Signed)
"   Anesthesia Post Note  Patient: Wanda Howell  Procedure(s) Performed: EGD (ESOPHAGOGASTRODUODENOSCOPY)     Patient location during evaluation: Endoscopy Anesthesia Type: MAC Level of consciousness: awake Pain management: pain level controlled Vital Signs Assessment: post-procedure vital signs reviewed and stable Respiratory status: spontaneous breathing Cardiovascular status: stable Postop Assessment: no apparent nausea or vomiting Anesthetic complications: no   No notable events documented.                  Lauraine DASEN Colhoun      "

## 2024-10-15 NOTE — Progress Notes (Signed)
 " Progress Note   Patient: Wanda Howell FMW:[id number] DOB: 02/27/1946 DOA: 10/14/2024     0 DOS: the patient was seen and examined on 10/15/2024   Brief hospital course:   79 y.o. female with hx of hypertension, hyperlipidemia, CAD, history of NSTEMI, history of CVA, alcohol abuse who was brought to the emergency department BIBEMS due to falls, generalized weakness.  EMS found the black tarry stools all over her apartment. ED work up showed  fecal occult blood was positive.  CBC showed white count of 14.4, hemoglobin 13.2 g/dL with an MCV of 889.5 fL and platelets 308.  Troponin was 38 then 36 ng/L.  Total CK3 156 units/L.  BMP showed a CO2 of 19 mmol/L with an anion gap of 18, the rest of the electrolytes were normal.  Glucose 134, BUN 32 and creatinine 1.71 mg/dL.  About 22+ months ago her creatinine level was 1.06 mg/dL.  nitial vital signs were temperature 97.6 F, pulse 164, respirations 20, BP 120/92 mmHg and O2 sat 97% on room air.  The patient was started on a Cardizem  infusion after 20 mg bolus dose and also received 1000 mL of LR. She was admitted for further worked.  Seen by GI and underwent EGD > no acute bleeding Seen by cardiology and started on eliquis   Assessment and Plan:   Atrial fibrillation with RVR (HCC) CHA2DS2-VASc score = 6  Observation/SDU. Continue diltiazem  drip Continue Eliquis  Follow cardiology recommendation    Melena S/p EGD > no active bleeding  Hemoglobin 12.6 and stable    Chronic kidney disease, stage 3a (HCC)  AKI versus disease progression. Creatine 1.35 today and improving. 1.71 on admission continue IV hydration. Avoid nephrotoxins agents    Elevated troponin TTE > Left ventricular ejection fraction, by estimation, is 40 to 45%. Left  ventricular ejection fraction by PLAX is 41 %. The left ventricle has  mildly decreased function. The left ventricle demonstrates global  hypokinesis. Left ventricular diastolic  function could not be evaluated.    2. Right ventricular systolic function is mildly reduced. The right  ventricular size is normal. There is normal pulmonary artery systolic  pressure. The estimated right ventricular systolic pressure is 30.1 mmHg.   3. Left atrial size was moderately dilated.   4. Right atrial size was mildly dilated.  Patient is asymptomatic.  Cardiology is following  Continue aspirin    CAD (coronary artery disease) Continue  aspirin .      Hypertension On Cardizem  infusion.     Hyperlipidemia Off statin due to alcohol use      Macrocytosis In the setting of alcohol abuse. Continue MVI, folate and thiamine .     Hypomagnesemia Replaced. Repeat level      History of CVA (cerebrovascular accident) Supportive care. Continue aspirin       Pressure injury of skin Continue local care. Continue preventive measures.    Subjective Denies sob, no chest pain, no headache, no dizziness, no nausea, no abdominal pain   Physical Exam: Vitals:   10/15/24 1500 10/15/24 1600 10/15/24 1613 10/15/24 1700  BP: (!) 126/93  127/73 138/81  Pulse: (!) 131 (!) 114 91 (!) 29  Resp: (!) 26 (!) 26 (!) 23 14  Temp: 97.9 F (36.6 C)     TempSrc: Oral     SpO2: 90% (!) 67% (!) 79% (!) 84%  Weight:      Height:       HEENT  Head atraumatic Neck supple Resp: no wheezes No crackles CVS: s1/2+  No JVD ABD: soft, +BS No tender EXT: no edema Neuro: A&0x3 Psych: moods appropriate  Data Reviewed:   Disposition: Status is: Inpatient Remains inpatient appropriate because: admitted with a.fib with rvr complicated by melena.   Planned Discharge Destination: Home  Time spent: 35 minutes  Author: Jefferson Marinas, MD 10/15/2024 6:53 PM  For on call review [email or web address].  "

## 2024-10-15 NOTE — Anesthesia Preprocedure Evaluation (Addendum)
"                                    Anesthesia Evaluation  Patient identified by MRN, date of birth, ID band Patient awake    Reviewed: Allergy & Precautions, NPO status , Patient's Chart, lab work & pertinent test results  History of Anesthesia Complications Negative for: history of anesthetic complications  Airway Mallampati: II  TM Distance: >3 FB Neck ROM: Full    Dental  (+) Poor Dentition   Pulmonary neg pulmonary ROS, neg sleep apnea, neg COPD, Patient abstained from smoking.Not current smoker   Pulmonary exam normal breath sounds clear to auscultation       Cardiovascular Exercise Tolerance: Good METShypertension, + CAD  (-) Past MI + dysrhythmias Atrial Fibrillation  Rhythm:Irregular Rate:Tachycardia - Systolic murmurs TTE 2026 1. Left ventricular ejection fraction, by estimation, is 40 to 45%. Left  ventricular ejection fraction by PLAX is 41 %. The left ventricle has  mildly decreased function. The left ventricle demonstrates global  hypokinesis. Left ventricular diastolic  function could not be evaluated.   2. Right ventricular systolic function is mildly reduced. The right  ventricular size is normal. There is normal pulmonary artery systolic  pressure. The estimated right ventricular systolic pressure is 30.1 mmHg.   3. Left atrial size was moderately dilated.   4. Right atrial size was mildly dilated.   5. The mitral valve is grossly normal. Trivial mitral valve  regurgitation.   6. The aortic valve was not well visualized. Aortic valve regurgitation  is not visualized.   7. The inferior vena cava is dilated in size with >50% respiratory  variability, suggesting right atrial pressure of 8 mmHg.     Neuro/Psych CVA, No Residual Symptoms  negative psych ROS   GI/Hepatic ,neg GERD  ,,(+)     (-) substance abuse    Endo/Other  neg diabetes    Renal/GU CRFRenal diseasenegative Renal ROS     Musculoskeletal   Abdominal   Peds   Hematology   Anesthesia Other Findings Past Medical History: 11/30/2022: Elevated troponin 10/14/2024: History of CVA (cerebrovascular accident) 12/01/2022: Vitamin D  deficiency  Reproductive/Obstetrics                              Anesthesia Physical Anesthesia Plan  ASA: 3  Anesthesia Plan: MAC   Post-op Pain Management: Minimal or no pain anticipated   Induction: Intravenous  PONV Risk Score and Plan: 2 and Propofol  infusion, TIVA and Ondansetron   Airway Management Planned: Nasal Cannula  Additional Equipment: None  Intra-op Plan:   Post-operative Plan:   Informed Consent: I have reviewed the patients History and Physical, chart, labs and discussed the procedure including the risks, benefits and alternatives for the proposed anesthesia with the patient or authorized representative who has indicated his/her understanding and acceptance.   Patient has DNR.  Continue DNR.   Dental advisory given  Plan Discussed with: CRNA and Surgeon  Anesthesia Plan Comments: (Discussed risks of anesthesia with patient, including possibility of difficulty with spontaneous ventilation under anesthesia necessitating airway intervention, PONV, and rare risks such as cardiac or respiratory or neurological events, and allergic reactions. Discussed the role of CRNA in patient's perioperative care. Patient understands.)         Anesthesia Quick Evaluation  "

## 2024-10-15 NOTE — Progress Notes (Signed)
"  °  Progress Note  Patient Name: Wanda Howell Date of Encounter: 10/15/2024 [facility] Cardiologist: Shelda Bruckner, MD   Interval Summary   Overnight with multiple pauses up to 6 seconds.  Diltiazem  stopped.  Mostly nocturnal pauses.  Seems to be unaware, no acute complaints today no shortness of breath or chest pain.  Reluctant to pursue EGD today.  Vital Signs Vitals:   10/15/24 0700 10/15/24 0800 10/15/24 0900 10/15/24 1000  BP: 106/79 106/77 130/89 135/81  Pulse: (!) 59 83 (!) 160 (!) 56  Resp: 15 15 15  (!) 24  Temp:  98 F (36.7 C)    TempSrc:  Oral    SpO2: 98% 97% 90% 98%  Weight:      Height:        Intake/Output Summary (Last 24 hours) at 10/15/2024 1043 Last data filed at 10/15/2024 0900 Gross per 24 hour  Intake 1324.65 ml  Output --  Net 1324.65 ml      10/14/2024    5:13 PM 11/29/2022    6:15 PM  Last 3 Weights  Weight (lbs) 132 lb 7.9 oz 128 lb 8.5 oz  Weight (kg) 60.1 kg 58.3 kg      Telemetry/ECG  Atrial fibrillation.  Nocturnal pauses, generally between 2 to 4 seconds.  She did have 6-second pause.- Personally Reviewed  Physical Exam  GEN: No acute distress.   Neck: No JVD Cardiac: IRRR, no murmurs, rubs, or gallops.  Respiratory: Clear to auscultation bilaterally. GI: Soft, nontender, non-distended  MS: No edema  Patient Profile Patient with past medical history significant for CVA, elevated troponin, pubic ramus fracture, osteoporosis, vitamin D  deficiency, habitual alcohol intake (2-3 glasses of wine nightly.  Patient currently evaluated for concerns of GI bleed with melena.  Found to be in atrial fibrillation.  She does report she did not actually fall but gradually went to the floor.  Assessment & Plan   New atrial fibrillation RVR In the setting of melena with concern GI bleed.  Found to be in atrial fibrillation and started on IV diltiazem , overnight she had pauses noted. Longest episodes in the deep at the night.  Pauses  between 2 to 4 seconds but did have 6-second pause that prompted discontinuation of diltiazem .  Heart rates remained to be controlled right now in the 90s while off diltiazem .  She is unaware of her arrhythmia. Continue to hold off on diltiazem , echocardiogram pending.  Volume status looks okay. Need to rule out GI bleed before committing to anticoagulation, CHA2DS2-VASc is 6. TSH normal  Melena Hemoglobin appears to be stable.  She is reluctant to pursue EGD today.  GI will come down and evaluate.  Alcohol use Reports 2 to 3 glasses of wine daily.  Would encourage cessation, could be contributing.  AKI on CKD Improving with IV fluids.   For questions or updates, please contact [facility] Please consult [email or web address] for contact info under       Signed, Thom LITTIE Sluder, PA-C    "

## 2024-10-15 NOTE — Transfer of Care (Addendum)
 Immediate Anesthesia Transfer of Care Note  Patient: Wanda Howell  Procedure(s) Performed: EGD (ESOPHAGOGASTRODUODENOSCOPY)  Patient Location: PACU  Anesthesia Type:MAC  Level of Consciousness: sedated  Airway & Oxygen Therapy: Patient Spontanous Breathing and Patient connected to face mask oxygen  Post-op Assessment: Report given to RN and Post -op Vital signs reviewed and stable  Post vital signs: Reviewed and stable  Last Vitals:  Vitals Value Taken Time  BP    Temp    Pulse    Resp    SpO2      Last Pain:  Vitals:   10/15/24 1155  TempSrc: Temporal  PainSc: 0-No pain         Complications: No notable events documented.

## 2024-10-15 NOTE — Progress Notes (Signed)
 Pt refused head CT this morning. RN provided education for pt about CT exam. Pt received education well and continues to refuse. CN notified, NP notified.

## 2024-10-15 NOTE — Progress Notes (Signed)
 Cardizem  gtt stopped per order. Patient HR got as low as 30's and jumped back up to the 60's followed by a 4 second pause. NP notified. RN reassessed pt at the bedside. Pt resting with eyes closed with chest rising and falling present

## 2024-10-16 ENCOUNTER — Telehealth (HOSPITAL_COMMUNITY): Payer: Self-pay

## 2024-10-16 ENCOUNTER — Other Ambulatory Visit (HOSPITAL_COMMUNITY): Payer: Self-pay

## 2024-10-16 LAB — BASIC METABOLIC PANEL WITH GFR
Anion gap: 10 (ref 5–15)
BUN: 22 mg/dL (ref 8–23)
CO2: 20 mmol/L — ABNORMAL LOW (ref 22–32)
Calcium: 8.7 mg/dL — ABNORMAL LOW (ref 8.9–10.3)
Chloride: 105 mmol/L (ref 98–111)
Creatinine, Ser: 1.11 mg/dL — ABNORMAL HIGH (ref 0.44–1.00)
GFR, Estimated: 51 mL/min — ABNORMAL LOW
Glucose, Bld: 96 mg/dL (ref 70–99)
Potassium: 4.2 mmol/L (ref 3.5–5.1)
Sodium: 135 mmol/L (ref 135–145)

## 2024-10-16 LAB — MAGNESIUM: Magnesium: 1.8 mg/dL (ref 1.7–2.4)

## 2024-10-16 MED ORDER — LACTATED RINGERS IV BOLUS
250.0000 mL | Freq: Once | INTRAVENOUS | Status: AC
  Administered 2024-10-16: 250 mL via INTRAVENOUS

## 2024-10-16 MED ORDER — METOPROLOL SUCCINATE ER 25 MG PO TB24
25.0000 mg | ORAL_TABLET | Freq: Two times a day (BID) | ORAL | Status: AC
  Administered 2024-10-16: 25 mg via ORAL
  Filled 2024-10-16: qty 1

## 2024-10-16 MED ORDER — LOSARTAN POTASSIUM 25 MG PO TABS
12.5000 mg | ORAL_TABLET | Freq: Every day | ORAL | Status: DC
  Administered 2024-10-16: 12.5 mg via ORAL
  Filled 2024-10-16: qty 0.5

## 2024-10-16 NOTE — Discharge Instructions (Addendum)

## 2024-10-16 NOTE — Evaluation (Signed)
 Physical Therapy Evaluation Patient Details Name: Wanda Howell MRN: [id number] DOB: 05/02/1946 Today's Date: 10/16/2024  History of Present Illness  Pt admitted from home 2* weakness and  fall - pt states was on the floor for hours before a neighbor heard her.  Pt found to be in afib with RVR and with noted melena.  Pt with hx of CVA, htn, CAD, NSTEMI (24), and ETOH abuse.  Clinical Impression  Pt admitted as above and presenting with functional mobility limitations 2* significant generalized weakness, bilat foot pain limiting WB, decreased activity tolerance and balance deficits.  This date, pt required significant assist of 2 to move to EOB sitting, to stand briefly with RW and to complete transition from bed to recliner with use of Stedy.  Pt states hopes to dc home but lives alone with limited assist.  Patient will benefit from continued inpatient follow up therapy, <3 hours/day to maximize IND and safety.         If plan is discharge home, recommend the following: Two people to help with walking and/or transfers;A lot of help with bathing/dressing/bathroom;Assistance with cooking/housework;Assist for transportation;Help with stairs or ramp for entrance   Can travel by private vehicle   No    Equipment Recommendations Rolling walker (2 wheels)  Recommendations for Other Services  OT consult    Functional Status Assessment Patient has had a recent decline in their functional status and demonstrates the ability to make significant improvements in function in a reasonable and predictable amount of time.     Precautions / Restrictions Precautions Precautions: Fall Restrictions Weight Bearing Restrictions Per Provider Order: No      Mobility  Bed Mobility Overal bed mobility: Needs Assistance Bed Mobility: Supine to Sit     Supine to sit: Mod assist, +2 for physical assistance, +2 for safety/equipment, HOB elevated, Used rails     General bed mobility comments: Increased time  with assist to bring trunk to upright and to complete rotation to EOB sitting using bed pad    Transfers Overall transfer level: Needs assistance Equipment used: Rolling walker (2 wheels) (Stedy) Transfers: Sit to/from Stand, Bed to chair/wheelchair/BSC Sit to Stand: Mod assist, +2 physical assistance, +2 safety/equipment, From elevated surface, Via lift equipment           General transfer comment: Pt stood briefly with RW but requiring significant assist to bring wt up and fwd and to balance with RW.  2nd attempt with Stedy to transition from bed to recliner Transfer via Lift Equipment: Stedy  Ambulation/Gait               General Gait Details: Not attempted 2* limited ability to stand and c/o foot pain  Stairs            Wheelchair Mobility     Tilt Bed    Modified Rankin (Stroke Patients Only)       Balance Overall balance assessment: Needs assistance Sitting-balance support: No upper extremity supported, Feet supported Sitting balance-Leahy Scale: Fair     Standing balance support: Bilateral upper extremity supported Standing balance-Leahy Scale: Poor                               Pertinent Vitals/Pain Pain Assessment Pain Assessment: Faces Faces Pain Scale: Hurts little more Pain Location: bilat feet (R>L) with WB.  Pt denies pain at rest    Home Living Family/patient expects to be discharged to:: Private  residence Living Arrangements: Alone Available Help at Discharge: Friend(s);Available PRN/intermittently Type of Home: Apartment Home Access: Elevator       Home Layout: One level Home Equipment: Cane - single point Additional Comments: Pt reports she lives next to [employer] and uses shopping buggy to go back and forth for her groceries    Prior Function Prior Level of Function : Independent/Modified Independent             Mobility Comments: walks to grocery with shopping cart       Extremity/Trunk Assessment    Upper Extremity Assessment Upper Extremity Assessment: Generalized weakness    Lower Extremity Assessment Lower Extremity Assessment: Generalized weakness    Cervical / Trunk Assessment Cervical / Trunk Assessment: Kyphotic  Communication   Communication Communication: No apparent difficulties    Cognition Arousal: Alert Behavior During Therapy: WFL for tasks assessed/performed   PT - Cognitive impairments: Memory, Problem solving                       PT - Cognition Comments: Pt unable to remember emergency contacks last name when SW in to inquire Following commands: Intact       Cueing Cueing Techniques: Verbal cues     General Comments      Exercises     Assessment/Plan    PT Assessment Patient needs continued PT services  PT Problem List Decreased strength;Decreased activity tolerance;Decreased balance;Decreased mobility;Decreased knowledge of use of DME;Pain       PT Treatment Interventions DME instruction;Gait training;Functional mobility training;Therapeutic activities;Therapeutic exercise;Balance training;Patient/family education    PT Goals (Current goals can be found in the Care Plan section)  Acute Rehab PT Goals Patient Stated Goal: Regain IND PT Goal Formulation: With patient Time For Goal Achievement: 10/30/24 Potential to Achieve Goals: Fair    Frequency Min 2X/week     Co-evaluation               AM-PAC PT 6 Clicks Mobility  Outcome Measure Help needed turning from your back to your side while in a flat bed without using bedrails?: A Lot Help needed moving from lying on your back to sitting on the side of a flat bed without using bedrails?: A Lot Help needed moving to and from a bed to a chair (including a wheelchair)?: A Lot Help needed standing up from a chair using your arms (e.g., wheelchair or bedside chair)?: A Lot Help needed to walk in hospital room?: Total Help needed climbing 3-5 steps with a railing? :  Total 6 Click Score: 10    End of Session Equipment Utilized During Treatment: Gait belt Activity Tolerance: Patient limited by fatigue;Patient limited by pain (foot pain with standing) Patient left: in chair;with call bell/phone within reach;with chair alarm set Nurse Communication: Mobility status;Need for lift equipment PT Visit Diagnosis: Unsteadiness on feet (R26.81);Muscle weakness (generalized) (M62.81);History of falling (Z91.81);Difficulty in walking, not elsewhere classified (R26.2);Pain Pain - part of body: Ankle and joints of foot    Time: 8641-8576 PT Time Calculation (min) (ACUTE ONLY): 25 min   Charges:   PT Evaluation $PT Eval Low Complexity: 1 Low PT Treatments $Therapeutic Activity: 8-22 mins PT General Charges $$ ACUTE PT VISIT: 1 Visit         [facility] PT Acute Rehabilitation Services Office [phone]   Calieb Lichtman 10/16/2024, 3:14 PM

## 2024-10-16 NOTE — Progress Notes (Addendum)
 "  Rounding Note   Patient Name: Wanda Howell Date of Encounter: 10/16/2024  [facility] Cardiologist: Shelda Bruckner, MD   Subjective - No acute events overnight - Patient remains in atrial fibrillation with somewhat better rate control on diltiazem   Scheduled Meds:  apixaban   5 mg Oral BID   Chlorhexidine  Gluconate Cloth  6 each Topical Daily   folic acid   1 mg Oral Daily   LORazepam   0-4 mg Oral Q4H   Followed by   LORazepam   0-4 mg Oral Q8H   multivitamin with minerals  1 tablet Oral Daily   pantoprazole   40 mg Oral Daily   thiamine   100 mg Oral Daily   Or   thiamine   100 mg Intravenous Daily   Continuous Infusions:  diltiazem  (CARDIZEM ) infusion 5 mg/hr (10/16/24 0500)   PRN Meds: acetaminophen  **OR** acetaminophen , LORazepam  **OR** LORazepam , ondansetron  **OR** ondansetron  (ZOFRAN ) IV, mouth rinse   Vital Signs  Vitals:   10/16/24 0330 10/16/24 0400 10/16/24 0430 10/16/24 0500  BP: 117/63 (!) 106/55 121/68 117/72  Pulse: 83 77 88 (!) 57  Resp: (!) 22 (!) 21 (!) 25 (!) 22  Temp:  98.4 F (36.9 C)    TempSrc:      SpO2: 97% 97% 96% 99%  Weight:      Height:        Intake/Output Summary (Last 24 hours) at 10/16/2024 0715 Last data filed at 10/16/2024 0500 Gross per 24 hour  Intake 1839.32 ml  Output --  Net 1839.32 ml      10/14/2024    5:13 PM 11/29/2022    6:15 PM  Last 3 Weights  Weight (lbs) 132 lb 7.9 oz 128 lb 8.5 oz  Weight (kg) 60.1 kg 58.3 kg      Telemetry Atrial fibrillation with RVR- Personally Reviewed  ECG  No new ECG  Physical Exam  GEN: No acute distress.   Neck: No JVD Cardiac: Tachycardic with irregularly irregular rhythm, no murmurs, rubs, or gallops.  Respiratory: Clear to auscultation bilaterally. GI: Soft, nontender, non-distended  MS: No edema; No deformity. Neuro:  Nonfocal  Psych: Normal affect   Labs High Sensitivity Troponin:  No results for input(s): TROPONINIHS in the last 720 hours.  Recent  Labs  Lab 10/14/24 0820 10/14/24 1036  TRNPT 38* 36*       Chemistry Recent Labs  Lab 10/14/24 0820 10/14/24 0852 10/14/24 1440 10/15/24 0337  NA 145 147*  --  143  K 3.5 3.7  --  4.0  CL 108 112*  --  109  CO2 19*  --   --  21*  GLUCOSE 134* 130*  --  93  BUN 32* 37*  --  28*  CREATININE 1.71* 1.80*  --  1.35*  CALCIUM  9.0  --   --  9.0  MG  --   --  1.5*  --   PROT  --   --  7.4 6.4*  ALBUMIN  --   --  3.8 3.4*  AST  --   --  33 26  ALT  --   --  16 12  ALKPHOS  --   --  89 75  BILITOT  --   --  0.8 0.7  GFRNONAA 30*  --   --  40*  ANIONGAP 18*  --   --  12    Lipids No results for input(s): CHOL, TRIG, HDL, LABVLDL, LDLCALC, CHOLHDL in the last 168 hours.  Hematology Recent  Labs  Lab 10/14/24 0820 10/14/24 0852 10/14/24 1440 10/15/24 0337  WBC 14.4*  --   --  9.4  RBC 3.83*  --   --  3.69*  HGB 13.2 13.6 13.4 12.6  HCT 42.3 40.0 43.3 40.7  MCV 110.4*  --   --  110.3*  MCH 34.5*  --   --  34.1*  MCHC 31.2  --   --  31.0  RDW 12.9  --   --  12.9  PLT 208  --   --  157   Thyroid  Recent Labs  Lab 10/14/24 0820  TSH 1.560    BNPNo results for input(s): BNP, PROBNP in the last 168 hours.  DDimer No results for input(s): DDIMER in the last 168 hours.   Radiology  ECHOCARDIOGRAM COMPLETE Result Date: 10/15/2024    ECHOCARDIOGRAM REPORT   Patient Name:   TASHONA CALK [facility] Date of Exam: 10/15/2024 Medical Rec #:  [id number]      Height:       70.0 in Accession #:    [id number]     Weight:       132.5 lb Date of Birth:  08/17/1946       BSA:          1.752 m Patient Age:    78 years       BP:           130/89 mmHg Patient Gender: F              HR:           85 bpm. Exam Location:  Inpatient Procedure: 2D Echo, Cardiac Doppler and Color Doppler (Both Spectral and Color            Flow Doppler were utilized during procedure). Indications:    Atrial Fibrillation I48.91  History:        Patient has prior history of Echocardiogram examinations, most                  recent 11/30/2022.  Sonographer:    Sydnee Wilson RDCS Referring Phys: [id number] DAYNA N DUNN  Sonographer Comments: Technically difficult study due to poor echo windows. Image acquisition challenging due to uncooperative patient and patient declined definity . IMPRESSIONS  1. Left ventricular ejection fraction, by estimation, is 40 to 45%. Left ventricular ejection fraction by PLAX is 41 %. The left ventricle has mildly decreased function. The left ventricle demonstrates global hypokinesis. Left ventricular diastolic function could not be evaluated.  2. Right ventricular systolic function is mildly reduced. The right ventricular size is normal. There is normal pulmonary artery systolic pressure. The estimated right ventricular systolic pressure is 30.1 mmHg.  3. Left atrial size was moderately dilated.  4. Right atrial size was mildly dilated.  5. The mitral valve is grossly normal. Trivial mitral valve regurgitation.  6. The aortic valve was not well visualized. Aortic valve regurgitation is not visualized.  7. The inferior vena cava is dilated in size with >50% respiratory variability, suggesting right atrial pressure of 8 mmHg. Comparison(s): Changes from prior study are noted. 11/30/2022: LVEF 65-70%. FINDINGS  Left Ventricle: Left ventricular ejection fraction, by estimation, is 40 to 45%. Left ventricular ejection fraction by PLAX is 41 %. The left ventricle has mildly decreased function. The left ventricle demonstrates global hypokinesis. The left ventricular internal cavity size was normal in size. There is no left ventricular hypertrophy. Left ventricular diastolic function could not be evaluated due to atrial fibrillation. Left  ventricular diastolic function could not be evaluated. Right Ventricle: The right ventricular size is normal. No increase in right ventricular wall thickness. Right ventricular systolic function is mildly reduced. There is normal pulmonary artery systolic pressure. The tricuspid  regurgitant velocity is 2.35 m/s, and with an assumed right atrial pressure of 8 mmHg, the estimated right ventricular systolic pressure is 30.1 mmHg. Left Atrium: Left atrial size was moderately dilated. Right Atrium: Right atrial size was mildly dilated. Pericardium: There is no evidence of pericardial effusion. Mitral Valve: The mitral valve is grossly normal. Trivial mitral valve regurgitation. Tricuspid Valve: The tricuspid valve is grossly normal. Tricuspid valve regurgitation is mild. Aortic Valve: The aortic valve was not well visualized. Aortic valve regurgitation is not visualized. Pulmonic Valve: The pulmonic valve was normal in structure. Pulmonic valve regurgitation is not visualized. Aorta: The aortic root and ascending aorta are structurally normal, with no evidence of dilitation. Venous: The inferior vena cava is dilated in size with greater than 50% respiratory variability, suggesting right atrial pressure of 8 mmHg. IAS/Shunts: No atrial level shunt detected by color flow Doppler.  LEFT VENTRICLE PLAX 2D LV EF:         Left            Diastology                ventricular     LV e' medial:    11.30 cm/s                ejection        LV E/e' medial:  6.8                fraction by     LV e' lateral:   20.00 cm/s                PLAX is 41      LV E/e' lateral: 3.9                %. LVIDd:         4.10 cm LVIDs:         3.30 cm LV PW:         1.10 cm LV IVS:        0.80 cm LVOT diam:     2.00 cm LV SV:         52 LV SV Index:   30 LVOT Area:     3.14 cm  RIGHT VENTRICLE RV S prime:     9.95 cm/s TAPSE (M-mode): 2.1 cm LEFT ATRIUM             Index        RIGHT ATRIUM           Index LA diam:        4.20 cm 2.40 cm/m   RA Area:     18.80 cm LA Vol (A2C):   55.3 ml 31.56 ml/m  RA Volume:   47.90 ml  27.34 ml/m LA Vol (A4C):   75.2 ml 42.92 ml/m LA Biplane Vol: 64.9 ml 37.04 ml/m  AORTIC VALVE LVOT Vmax:   84.90 cm/s LVOT Vmean:  53.700 cm/s LVOT VTI:    0.165 m  AORTA Ao Root diam: 3.20 cm Ao  Asc diam:  3.10 cm MITRAL VALVE               TRICUSPID VALVE MV Area (PHT): 4.68 cm    TR Peak grad:   22.1 mmHg MV  E velocity: 77.10 cm/s  TR Vmax:        235.00 cm/s MV A velocity: 36.40 cm/s MV E/A ratio:  2.12        SHUNTS                            Systemic VTI:  0.16 m                            Systemic Diam: 2.00 cm Vinie Maxcy MD Electronically signed by Vinie Maxcy MD Signature Date/Time: 10/15/2024/12:26:39 PM    Final     Cardiac Studies  TTE 10/15/24:  IMPRESSIONS     1. Left ventricular ejection fraction, by estimation, is 40 to 45%. Left  ventricular ejection fraction by PLAX is 41 %. The left ventricle has  mildly decreased function. The left ventricle demonstrates global  hypokinesis. Left ventricular diastolic  function could not be evaluated.   2. Right ventricular systolic function is mildly reduced. The right  ventricular size is normal. There is normal pulmonary artery systolic  pressure. The estimated right ventricular systolic pressure is 30.1 mmHg.   3. Left atrial size was moderately dilated.   4. Right atrial size was mildly dilated.   5. The mitral valve is grossly normal. Trivial mitral valve  regurgitation.   6. The aortic valve was not well visualized. Aortic valve regurgitation  is not visualized.   7. The inferior vena cava is dilated in size with >50% respiratory  variability, suggesting right atrial pressure of 8 mmHg.   Patient Profile   BRALEY LUCKENBAUGH [facility]) is a 79 y.o. female with a hx of CVA, elevated troponin, pubic ramus fracture, osteoporosis, vitamin D  deficiency, habitual alcohol intake (2-3 glasses of wine nightly) who is being seen 10/14/2024 for the evaluation of AFib at the request of Dr. Celinda.   Assessment & Plan   #New Atrial Fibrillation w/ RVR - Patient admitted after suffering a fall from generalized weakness and found to have multiple black tarry stools concerning for GI bleed. - Patient was noted to be tachycardic and new  atrial fibrillation with RVR. - She is currently being managed with IV diltiazem  with adequate rate control. - The patient's CHA2DS2-VASc score = 6 which necessitates long-term OAC. - Patient underwent EGD on 10/15/2024 without evidence of GI bleeding and hemoglobins have remained stable. - I discussed treatment options with the patient and recommended that she undergo TEE plus DCCV to achieve rhythm control of her atrial fibrillation.  The patient does not want to undergo a DCCV at this time because she is frightened by the idea of the procedure.  She would rather wait 4 weeks on uninterrupted oral anticoagulation before considering a DCCV in the future if she remains in atrial fibrillation. 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 - I recommended to the patient that we get a limited echo today with the use of contrast to better see what her LVEF is.  For reasons that are unclear to me she has a lot of concern about receiving  the contrast for echo images.  She refused to have contrast administration yesterday during her echocardiogram and felt that the imaging process was very uncomfortable.  I instructed her to let the nurse know to notify me if she changes her mind regarding agreeing to getting a repeat limited echo.  If not then I will empirically treat for HFmrEF. Starting metoprolol  as above Plan to start low-dose losartan  pending blood work today   #AKI on CKD - Suspect prerenal in the setting of GI bleed Daily BMP     #Melena #PUD - Patient's hemoglobin is normal but there was reportedly multiple black tarry stools seen by  EMS. -The patient stool was Hemoccult positive. - She underwent EGD on 10/15/2024 which was negative for acute bleed and just showed some ulceration. -Patient cleared for oral anticoagulation by GI Management per primary/GI    CRITICAL CARE Performed by: Georganna Archer   Total critical care time: 40 minutes  Critical care time was exclusive of separately billable procedures and treating other patients.  Critical care was necessary to treat or prevent imminent or life-threatening deterioration.  Critical care was time spent personally by me on the following activities: development of treatment plan with patient and/or surrogate as well as nursing, discussions with consultants, evaluation of patient's response to treatment, examination of patient, obtaining history from patient or surrogate, ordering and performing treatments and interventions, ordering and review of laboratory studies, ordering and review of radiographic studies, pulse oximetry and re-evaluation of patient's condition.    For questions or updates, please contact [facility] Please consult [email or web address] for contact info under       Signed, Georganna Archer, MD  10/16/2024, 7:15 AM    "

## 2024-10-16 NOTE — Progress Notes (Incomplete)
" ° ° ° °  Patient Name: Wanda Howell           DOB: 05-Oct-1945  MRN: [id number]      Admission Date: 10/14/2024  Attending Provider: Mickle Magnus, MD  Primary Diagnosis: Atrial fibrillation with RVR (HCC)   Level of care: Stepdown   OVERNIGHT EVENT  Notified by bedside RN that patient is mildly hypotensive, BP 80/50s MAP 56-61. Patient is asymptomatic, denies lightheadedness, dizziness, chest discomfort, palpitations, nausea, vomiting, diarrhea.  All other vital stable, afebrile. Patient states she has been eating and drinking well.   Earlier today, she received Toprol  XL and Eliquis  for A-fib, also received losartan .   Patient on Eliquis  for A-fib, also received losartan , Toprol  XL for A-fib, previously on Cardizem .  Plan:    Addendum:    Carlene Bickley, DNP, ACNPC- AG [facility]    "

## 2024-10-16 NOTE — Plan of Care (Signed)
  Problem: Education: Goal: Knowledge of General Education information will improve Description: Including pain rating scale, medication(s)/side effects and non-pharmacologic comfort measures Outcome: Progressing   Problem: Clinical Measurements: Goal: Diagnostic test results will improve Outcome: Progressing Goal: Cardiovascular complication will be avoided Outcome: Progressing   

## 2024-10-16 NOTE — NC FL2 (Signed)
 " [place]  MEDICAID FL2 LEVEL OF CARE FORM     IDENTIFICATION  Patient Name: Wanda Howell Birthdate: 1946/09/01 Sex: female Admission Date (Current Location): 10/14/2024  [facility] and [place] Number:  Producer, Television/film/video and Address:  [facility],  [address], [place] [address]      Provider Number: [id number]  Attending Physician Name and Address:  Mickle Magnus, MD  Relative Name and Phone Number:  Doyal HAMS (friend) [phone]    Current Level of Care: Hospital Recommended Level of Care: Skilled Nursing Facility Prior Approval Number:    Date Approved/Denied:   PASRR Number: [id number]  Discharge Plan: SNF    Current Diagnoses: Patient Active Problem List   Diagnosis Date Noted   Atrial fibrillation with RVR (HCC) 10/14/2024   History of CVA (cerebrovascular accident) 10/14/2024   CAD (coronary artery disease) 10/14/2024   Hypertension 10/14/2024   Hyperlipidemia 10/14/2024   Macrocytosis 10/14/2024   Hypomagnesemia 10/14/2024   Pressure injury of skin 10/14/2024   Melena 10/14/2024   Elevated serum creatinine 10/14/2024   Chronic kidney disease, stage 3a (HCC) 10/14/2024   Fall 11/30/2022   Scalp laceration 11/30/2022   Elevated troponin 11/30/2022   Acute CVA (cerebrovascular accident) (HCC) 11/29/2022   Fracture of ramus of left pubis (HCC) 11/29/2022    Orientation RESPIRATION BLADDER Height & Weight     Self, Time, Situation  Normal Continent Weight: 60.1 kg Height:  5' 10 (177.8 cm)  BEHAVIORAL SYMPTOMS/MOOD NEUROLOGICAL BOWEL NUTRITION STATUS      Continent Diet (regular)  AMBULATORY STATUS COMMUNICATION OF NEEDS Skin   Extensive Assist Verbally Skin abrasions, Bruising (abrasions left upper face; erythema bil elbow, shoulder, buttocks, and coccyx; generalized ecchymosis)                       Personal Care Assistance Level of Assistance  Bathing, Feeding, Dressing Bathing Assistance: Maximum assistance Feeding  assistance: Independent Dressing Assistance: Limited assistance     Functional Limitations Info  Sight, Hearing, Speech Sight Info: Impaired (glasses, contacts) Hearing Info: Adequate Speech Info: Adequate    SPECIAL CARE FACTORS FREQUENCY  PT (By licensed PT), OT (By licensed OT)     PT Frequency: 5x/week OT Frequency: 5x/week            Contractures Contractures Info: Not present    Additional Factors Info  Code Status, Allergies Code Status Info: DNR Allergies Info: NKDA           Current Medications (10/16/2024):  This is the current hospital active medication list Current Facility-Administered Medications  Medication Dose Route Frequency Provider Last Rate Last Admin   acetaminophen  (TYLENOL ) tablet 650 mg  650 mg Oral Q6H PRN Rosalie Kitchens, MD       Or   acetaminophen  (TYLENOL ) suppository 650 mg  650 mg Rectal Q6H PRN Rosalie Kitchens, MD       apixaban  (ELIQUIS ) tablet 5 mg  5 mg Oral BID Legge, Justin M, RPH   5 mg at 10/16/24 9046   Chlorhexidine  Gluconate Cloth 2 % PADS 6 each  6 each Topical Daily Rosalie Kitchens, MD   6 each at 10/16/24 9046   folic acid  (FOLVITE ) tablet 1 mg  1 mg Oral Daily Rosalie Kitchens, MD   1 mg at 10/16/24 9046   LORazepam  (ATIVAN ) tablet 1-4 mg  1-4 mg Oral Q1H PRN Rosalie Kitchens, MD       Or   LORazepam  (ATIVAN ) injection 1-4 mg  1-4 mg Intravenous Q1H PRN Rosalie Kitchens, MD       LORazepam  (ATIVAN ) tablet 0-4 mg  0-4 mg Oral Q8H Magod, Kitchens, MD       losartan  (COZAAR ) tablet 12.5 mg  12.5 mg Oral Daily Floretta Mallard, MD   12.5 mg at 10/16/24 1550   metoprolol  succinate (TOPROL -XL) 24 hr tablet 25 mg  25 mg Oral BID Floretta Mallard, MD   25 mg at 10/16/24 9045   multivitamin with minerals tablet 1 tablet  1 tablet Oral Daily Rosalie Kitchens, MD   1 tablet at 10/16/24 9047   ondansetron  (ZOFRAN ) tablet 4 mg  4 mg Oral Q6H PRN Rosalie Kitchens, MD       Or   ondansetron  (ZOFRAN ) injection 4 mg  4 mg Intravenous Q6H PRN Rosalie Kitchens, MD       Oral care mouth  rinse  15 mL Mouth Rinse PRN Rosalie Kitchens, MD       pantoprazole  (PROTONIX ) EC tablet 40 mg  40 mg Oral Daily Rosalie Kitchens, MD   40 mg at 10/16/24 9047   thiamine  (VITAMIN B1) tablet 100 mg  100 mg Oral Daily Rosalie Kitchens, MD   100 mg at 10/16/24 9046   Or   thiamine  (VITAMIN B1) injection 100 mg  100 mg Intravenous Daily Rosalie Kitchens, MD         Discharge Medications: Please see discharge summary for a list of discharge medications.  Relevant Imaging Results:  Relevant Lab Results:   Additional Information SSN [id number]  Sonda Manuella Quill, RN     "

## 2024-10-16 NOTE — Progress Notes (Addendum)
 " Progress Note   Patient: Wanda Howell FMW:[id number] DOB: 05-30-1946 DOA: 10/14/2024     1 DOS: the patient was seen and examined on 10/16/2024   Brief hospital course:   79 y.o. female with hx of hypertension, hyperlipidemia, CAD, history of NSTEMI, history of CVA, alcohol abuse who was brought to the emergency department BIBEMS due to falls, generalized weakness.  EMS found the black tarry stools all over her apartment. ED work up showed  fecal occult blood was positive.  CBC showed white count of 14.4, hemoglobin 13.2 g/dL with an MCV of 889.5 fL and platelets 308.  Troponin was 38 then 36 ng/L.  Total CK3 156 units/L.  BMP showed a CO2 of 19 mmol/L with an anion gap of 18, the rest of the electrolytes were normal.  Glucose 134, BUN 32 and creatinine 1.71 mg/dL.  About 22+ months ago her creatinine level was 1.06 mg/dL.  nitial vital signs were temperature 97.6 F, pulse 164, respirations 20, BP 120/92 mmHg and O2 sat 97% on room air.  The patient was started on a Cardizem  infusion after 20 mg bolus dose and also received 1000 mL of LR. She was admitted for further worked up. Seen by GI and underwent EGD > no acute bleeding Initially she was on diltiazem  drip which has been discontinue today and started on metoprolol . Cardiology is following and manage rate control medication. Assessment and Plan:   New onset  atrial fibrillation with RVR (HCC) Noted she had a fall before she went into A.fib  CHA2DS2-VASc score = 6  Initially started on  diltiazem  drip which has been discontinued. Started on metoprolol  XL 25 mg daily Continue Eliquis  Cardiology noted reviewed > Discussed DCCV inpatient but patient wanted to wait. This is being scheduled for outpatient    Melena S/p EGD > no active bleeding  Hemoglobin 12.6 and stable    Chronic kidney disease, stage 3a (HCC)  AKI versus disease progression. Creatine 1.11 and improving. Avoid nephrotoxins agents  HFMrEF  TTE > Left ventricular ejection  fraction, by estimation, is 40 to 45%. Left  ventricular ejection fraction by PLAX is 41 %. The left ventricle has  mildly decreased function. The left ventricle demonstrates global  hypokinesis. Left ventricular diastolic  function could not be evaluated.   2. Right ventricular systolic function is mildly reduced. The right  ventricular size is normal. There is normal pulmonary artery systolic  pressure. The estimated right ventricular systolic pressure is 30.1 mmHg.   3. Left atrial size was moderately dilated.   4. Right atrial size was mildly dilated.  Continue metoprolol  .  Her renal function improved. She can be started on low dose Losartan  as recommended by cardiology Optimization of GDMT per cardiology  Cardiology notes reviewed > plan to get limited echo    CAD (coronary artery disease) Continue  aspirin  Continue metoprolol  and losartan        Hypertension On metoprolol  and Losartan       Hyperlipidemia Need to start statin outpatient      Macrocytosis In the setting of alcohol abuse. Continue MVI, folate and thiamine .     Hypomagnesemia Replaced.      History of CVA (cerebrovascular accident) Supportive care. Continue aspirin       Pressure injury of skin Continue local care. Continue preventive measures.  Alcohol use Continue CIWA protocol Counsel on alcohol cessation  Continue folic and Thiamine   Subjective No overnight event. Denies sob, no chest pain, no headache,  no dizziness, no  abdominal pain, no nausea, no vomiting  Physical Exam: Vitals:   10/16/24 0700 10/16/24 0730 10/16/24 0845 10/16/24 1242  BP: 118/82 (!) 128/52    Pulse: (!) 55 66    Resp: (!) 25 (!) 22    Temp:   98.2 F (36.8 C) 98 F (36.7 C)  TempSrc:   Oral Oral  SpO2: 97% 95%    Weight:      Height:       HEENT  Head atraumatic Neck supple Resp: no wheezes No crackles CVS: s1/2+ No JVD ABD: soft, +BS No tender EXT: no edema Neuro: A&0x3 Psych: moods appropriate   Data Reviewed:   Disposition: Status is: Inpatient Remains inpatient appropriate because: admitted with a.fib with rvr complicated by melena. Inpatient care needed for rate control   Planned Discharge Destination: Home  Time spent: 35 minutes  Author: Jefferson Marinas, MD 10/16/2024 1:49 PM Patient refused to have CT head  which was ordered on 10/14/24 For on call review [email or web address].  "

## 2024-10-16 NOTE — Telephone Encounter (Signed)
 Pharmacy Patient Advocate Encounter  Patient does not have any prescription coverage, only Medicare part A&B.

## 2024-10-16 NOTE — TOC Initial Note (Signed)
 Transition of Care [facility]) - Initial/Assessment Note    Patient Details  Name: Wanda Howell MRN: [id number] Date of Birth: 07-31-1946  [facility]) CM/SW Contact:    Sonda Manuella Quill, RN Phone Number: 10/16/2024, 5:43 PM  Clinical Narrative:                 Acknowledge consult for SA counseling/education; also recc for SNF; spoke w/ pt in room; pt said she lives at home; she identified POC friend Doyal SAUNDERS. [phone]); pt said she will discuss finding PCP w/ her cardiologist; insurance verified; pt denied SDOH risks but would like resources for future reference; she has cane, crutches, and walker; pt does not have HH services or home oxygen; she wears glasses/contacts, and is continent of bowel/bladder; pt said she can feed herself; pt declined resources for SA; pt agreed to SNF; explained SNF process; pt verbalized understanding; she does not have facility preference; resources for social services and financial assist placed in d/c instructions; copy also given to pt; pt verbalized understanding she will make her own appt w/ agencies of choice; obtained PASRR # [id number]; FL2 completed and sent for co-sign; faxed out for bed; awaiting offers; IP CM is following.  Expected Discharge Plan: Skilled Nursing Facility Barriers to Discharge: Continued Medical Work up   Patient Goals and CMS Choice Patient states their goals for this hospitalization and ongoing recovery are:: short-term rehab     [place] ownership interest in [facility].provided to:: Patient    Expected Discharge Plan and Services   Discharge Planning Services: CM Consult   Living arrangements for the past 2 months: Apartment                 DME Arranged: N/A DME Agency: NA       HH Arranged: NA HH Agency: NA        Prior Living Arrangements/Services Living arrangements for the past 2 months: Apartment Lives with:: Self Patient language and need for interpreter reviewed:: Yes Do  you feel safe going back to the place where you live?: Yes      Need for Family Participation in Patient Care: Yes (Comment) Care giver support system in place?: Yes (comment) Current home services: DME (cane, crutches, walker) Criminal Activity/Legal Involvement Pertinent to Current Situation/Hospitalization: No - Comment as needed  Activities of Daily Living   ADL Screening (condition at time of admission) Independently performs ADLs?: Yes (appropriate for developmental age) Is the patient deaf or have difficulty hearing?: No Does the patient have difficulty seeing, even when wearing glasses/contacts?: No Does the patient have difficulty concentrating, remembering, or making decisions?: No  Permission Sought/Granted Permission sought to share information with : Case Manager Permission granted to share information with : Yes, Verbal Permission Granted  Share Information with NAME: Case Manager     Permission granted to share info w Relationship: Doyal HAMS (friend) [phone]     Emotional Assessment Appearance:: Appears stated age Attitude/Demeanor/Rapport: Gracious Affect (typically observed): Accepting Orientation: : Oriented to Self, Oriented to Place, Oriented to  Time, Oriented to Situation Alcohol / Substance Use: Not Applicable Psych Involvement: No (comment)  Admission diagnosis:  Atrial fibrillation with RVR (HCC) [I48.91] Patient Active Problem List   Diagnosis Date Noted   Atrial fibrillation with RVR (HCC) 10/14/2024   History of CVA (cerebrovascular accident) 10/14/2024   CAD (coronary artery disease) 10/14/2024   Hypertension 10/14/2024   Hyperlipidemia 10/14/2024   Macrocytosis 10/14/2024   Hypomagnesemia 10/14/2024  Pressure injury of skin 10/14/2024   Melena 10/14/2024   Elevated serum creatinine 10/14/2024   Chronic kidney disease, stage 3a (HCC) 10/14/2024   Fall 11/30/2022   Scalp laceration 11/30/2022   Elevated troponin 11/30/2022   Acute CVA  (cerebrovascular accident) (HCC) 11/29/2022   Fracture of ramus of left pubis (HCC) 11/29/2022   PCP:  Patient, No Pcp Per Pharmacy:   [facility] - [place], [place] - [address] [place] [address] Phone: [phone] Fax: [phone]  [facility] [address] [place] [address] Phone: [phone] Fax: [phone]  [facility] [id number] [place], [place] - [address] [place] [address] Phone: [phone] Fax: [phone]     Social Drivers of Health (SDOH) Social History: SDOH Screenings   Food Insecurity: No Food Insecurity (10/16/2024)  Housing: Low Risk (10/16/2024)  Transportation Needs: No Transportation Needs (10/16/2024)  Utilities: Not At Risk (10/16/2024)  Social Connections: Socially Isolated (10/14/2024)  Tobacco Use: Unknown (10/15/2024)   SDOH Interventions: Food Insecurity Interventions: Intervention Not Indicated, Inpatient TOC Housing Interventions: Intervention Not Indicated, Inpatient TOC Transportation Interventions: Intervention Not Indicated, Inpatient TOC Utilities Interventions: Intervention Not Indicated, Inpatient TOC   Readmission Risk Interventions     No data to display

## 2024-10-16 NOTE — Progress Notes (Signed)
 [place] KANDICE Raven 11:42 AM  Subjective: Patient doing well without any post endoscopic problems and no GI complaints at this time and she is thinking about cardiology workup  Objective: Vital signs stable afebrile no acute distress abdomen is soft nontender hemoglobin stable BUN and creatinine improved still just a touch higher than baseline  Assessment: Seemingly resolved melena  Plan: Please call us  this weekend or during this hospital stay if we could be of any further assistance and I am happy to see her back as an outpatient to see how she does on blood thinners and make sure no further GI workup is needed  [facility]  office [phone] After 5PM or if no answer call [phone]

## 2024-11-05 ENCOUNTER — Ambulatory Visit: Admitting: Cardiology
